# Patient Record
Sex: Male | Born: 1960 | Race: White | Hispanic: No | Marital: Married | Smoking: Never smoker
Health system: Southern US, Community
[De-identification: ages and names within clinical notes are randomized; demographics above are authoritative.]

## PROBLEM LIST (undated history)

## (undated) DIAGNOSIS — J189 Pneumonia, unspecified organism: Secondary | ICD-10-CM

## (undated) DIAGNOSIS — G4730 Sleep apnea, unspecified: Secondary | ICD-10-CM

## (undated) DIAGNOSIS — T8859XA Other complications of anesthesia, initial encounter: Secondary | ICD-10-CM

## (undated) DIAGNOSIS — T4145XA Adverse effect of unspecified anesthetic, initial encounter: Secondary | ICD-10-CM

## (undated) DIAGNOSIS — Z460 Encounter for fitting and adjustment of spectacles and contact lenses: Secondary | ICD-10-CM

## (undated) DIAGNOSIS — E039 Hypothyroidism, unspecified: Secondary | ICD-10-CM

## (undated) DIAGNOSIS — I10 Essential (primary) hypertension: Secondary | ICD-10-CM

## (undated) DIAGNOSIS — K649 Unspecified hemorrhoids: Secondary | ICD-10-CM

## (undated) DIAGNOSIS — J302 Other seasonal allergic rhinitis: Secondary | ICD-10-CM

## (undated) DIAGNOSIS — Z9889 Other specified postprocedural states: Secondary | ICD-10-CM

## (undated) DIAGNOSIS — R112 Nausea with vomiting, unspecified: Secondary | ICD-10-CM

## (undated) HISTORY — PX: COLONOSCOPY: SHX174

---
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
## 1992-05-28 DIAGNOSIS — J189 Pneumonia, unspecified organism: Secondary | ICD-10-CM

## 1992-05-28 HISTORY — DX: Pneumonia, unspecified organism: J18.9

## 2011-08-16 ENCOUNTER — Ambulatory Visit (HOSPITAL_COMMUNITY)
Admission: RE | Admit: 2011-08-16 | Discharge: 2011-08-16 | Disposition: A | Discharge status: Home / Self Care | Payer: PRIVATE HEALTH INSURANCE | Source: Ambulatory Visit | Attending: Family Medicine | Admitting: Family Medicine

## 2011-08-16 ENCOUNTER — Other Ambulatory Visit: Payer: Self-pay | Admitting: Family Medicine

## 2011-08-16 ENCOUNTER — Encounter (HOSPITAL_COMMUNITY): Payer: Self-pay

## 2011-08-16 DIAGNOSIS — R05 Cough: Secondary | ICD-10-CM | POA: Insufficient documentation

## 2011-08-16 DIAGNOSIS — R059 Cough, unspecified: Secondary | ICD-10-CM | POA: Insufficient documentation

## 2011-08-16 DIAGNOSIS — R509 Fever, unspecified: Secondary | ICD-10-CM | POA: Insufficient documentation

## 2011-08-16 DIAGNOSIS — R062 Wheezing: Secondary | ICD-10-CM | POA: Insufficient documentation

## 2011-08-16 HISTORY — DX: Essential (primary) hypertension: I10

## 2012-12-01 ENCOUNTER — Ambulatory Visit (INDEPENDENT_AMBULATORY_CARE_PROVIDER_SITE_OTHER): Payer: PRIVATE HEALTH INSURANCE | Admitting: Family Medicine

## 2012-12-01 ENCOUNTER — Encounter: Payer: Self-pay | Admitting: Family Medicine

## 2012-12-01 VITALS — BP 128/80 | HR 80 | Ht 71.75 in | Wt 246.0 lb

## 2012-12-01 DIAGNOSIS — R5381 Other malaise: Secondary | ICD-10-CM

## 2012-12-01 DIAGNOSIS — G4733 Obstructive sleep apnea (adult) (pediatric): Secondary | ICD-10-CM

## 2012-12-01 DIAGNOSIS — I10 Essential (primary) hypertension: Secondary | ICD-10-CM

## 2012-12-01 DIAGNOSIS — F528 Other sexual dysfunction not due to a substance or known physiological condition: Secondary | ICD-10-CM

## 2012-12-01 DIAGNOSIS — E785 Hyperlipidemia, unspecified: Secondary | ICD-10-CM

## 2012-12-01 DIAGNOSIS — Z0000 Encounter for general adult medical examination without abnormal findings: Secondary | ICD-10-CM

## 2012-12-01 NOTE — Patient Instructions (Addendum)
DASH Diet  The DASH diet stands for "Dietary Approaches to Stop Hypertension." It is a healthy eating plan that has been shown to reduce high blood pressure (hypertension) in as little as 14 days, while also possibly providing other significant health benefits. These other health benefits include reducing the risk of breast cancer after menopause and reducing the risk of type 2 diabetes, heart disease, colon cancer, and stroke. Health benefits also include weight loss and slowing kidney failure in patients with chronic kidney disease.   DIET GUIDELINES  · Limit salt (sodium). Your diet should contain less than 1500 mg of sodium daily.  · Limit refined or processed carbohydrates. Your diet should include mostly whole grains. Desserts and added sugars should be used sparingly.  · Include small amounts of heart-healthy fats. These types of fats include nuts, oils, and tub margarine. Limit saturated and trans fats. These fats have been shown to be harmful in the body.  CHOOSING FOODS   The following food groups are based on a 2000 calorie diet. See your Registered Dietitian for individual calorie needs.  Grains and Grain Products (6 to 8 servings daily)  · Eat More Often: Whole-wheat bread, brown rice, whole-grain or wheat pasta, quinoa, popcorn without added fat or salt (air popped).  · Eat Less Often: White bread, white pasta, white rice, cornbread.  Vegetables (4 to 5 servings daily)  · Eat More Often: Fresh, frozen, and canned vegetables. Vegetables may be raw, steamed, roasted, or grilled with a minimal amount of fat.  · Eat Less Often/Avoid: Creamed or fried vegetables. Vegetables in a cheese sauce.  Fruit (4 to 5 servings daily)  · Eat More Often: All fresh, canned (in natural juice), or frozen fruits. Dried fruits without added sugar. One hundred percent fruit juice (½ cup [237 mL] daily).  · Eat Less Often: Dried fruits with added sugar. Canned fruit in light or heavy syrup.  Lean Meats, Fish, and Poultry (2  servings or less daily. One serving is 3 to 4 oz [85-114 g]).  · Eat More Often: Ninety percent or leaner ground beef, tenderloin, sirloin. Round cuts of beef, chicken breast, turkey breast. All fish. Grill, bake, or broil your meat. Nothing should be fried.  · Eat Less Often/Avoid: Fatty cuts of meat, turkey, or chicken leg, thigh, or wing. Fried cuts of meat or fish.  Dairy (2 to 3 servings)  · Eat More Often: Low-fat or fat-free milk, low-fat plain or light yogurt, reduced-fat or part-skim cheese.  · Eat Less Often/Avoid: Milk (whole, 2%). Whole milk yogurt. Full-fat cheeses.  Nuts, Seeds, and Legumes (4 to 5 servings per week)  · Eat More Often: All without added salt.  · Eat Less Often/Avoid: Salted nuts and seeds, canned beans with added salt.  Fats and Sweets (limited)  · Eat More Often: Vegetable oils, tub margarines without trans fats, sugar-free gelatin. Mayonnaise and salad dressings.  · Eat Less Often/Avoid: Coconut oils, palm oils, butter, stick margarine, cream, half and half, cookies, candy, pie.  FOR MORE INFORMATION  The Dash Diet Eating Plan: www.dashdiet.org  Document Released: 05/03/2011 Document Revised: 08/06/2011 Document Reviewed: 05/03/2011  ExitCare® Patient Information ©2014 [employer].

## 2012-12-01 NOTE — Progress Notes (Addendum)
  Subjective:    Patient ID: Mitchell Holden, male    DOB: [birth year], 52 y.o.   MRN: [id number]  HPI Overall pt doing well. He relates he could increase exercise and decrease calories. Wellness discussed in detail. Also decreasing risk for MI/HTN/DM discussed. Pt w/o hematuria or rectal bleeding. Compliant with meds. Pros and cons of testosterone discussed including risk of MI,stroke with testos and how that increases with age. Pt prefers to keep as is for now. Patient in today for a physical. Need form filled out for work.  Review of Systems  Constitutional: Negative for fever, activity change and appetite change.  HENT: Negative for congestion, rhinorrhea and neck pain.   Eyes: Negative for discharge.  Respiratory: Negative for cough and wheezing.   Cardiovascular: Negative for chest pain.  Gastrointestinal: Negative for vomiting, abdominal pain and blood in stool.  Genitourinary: Negative for frequency and difficulty urinating.  Skin: Negative for rash.  Allergic/Immunologic: Negative for environmental allergies and food allergies.  Neurological: Negative for weakness and headaches.  Psychiatric/Behavioral: Negative for agitation.       Objective:   Physical Exam  Nursing note and vitals reviewed. Constitutional: He appears well-developed and well-nourished.  HENT:  Head: Normocephalic and atraumatic.  Right Ear: External ear normal.  Left Ear: External ear normal.  Nose: Nose normal.  Mouth/Throat: Oropharynx is clear and moist.  Eyes: EOM are normal. Pupils are equal, round, and reactive to light.  Neck: Normal range of motion. Neck supple. No thyromegaly present.  Cardiovascular: Normal rate, regular rhythm and normal heart sounds.   No murmur heard. Pulmonary/Chest: Effort normal and breath sounds normal. No respiratory distress. He has no wheezes.  Abdominal: Soft. Bowel sounds are normal. He exhibits no distension and no mass. There is no tenderness.   Genitourinary: Penis normal.  Musculoskeletal: Normal range of motion. He exhibits no edema.  Lymphadenopathy:    He has no cervical adenopathy.  Neurological: He is alert. He exhibits normal muscle tone.  Skin: Skin is warm and dry. No erythema.  Psychiatric: He has a normal mood and affect. His behavior is normal. Judgment normal.          Assessment & Plan:  To do labs,then fill in form Continue testosterone- potential risk discussd Lose weight, increase activity, watch diet UTD on colon Recheck 6 months/due PSA then Refer for out pt sleep study pt states he can't afford inpatient study   This patient was seen for wellness exam on this date

## 2012-12-02 LAB — CBC WITH DIFFERENTIAL/PLATELET
Basophils Absolute: 0 10*3/uL (ref 0.0–0.1)
Eosinophils Absolute: 0.2 10*3/uL (ref 0.0–0.7)
Lymphs Abs: 2.5 10*3/uL (ref 0.7–4.0)
MCH: 28.3 pg (ref 26.0–34.0)
Neutrophils Relative %: 65 % (ref 43–77)
Platelets: 255 10*3/uL (ref 150–400)
RBC: 5.55 MIL/uL (ref 4.22–5.81)
RDW: 14.3 % (ref 11.5–15.5)
WBC: 8.8 10*3/uL (ref 4.0–10.5)

## 2012-12-02 LAB — LIPID PANEL
HDL: 29 mg/dL — ABNORMAL LOW (ref >39)
LDL Cholesterol: 62 mg/dL (ref 0–99)
Triglycerides: 181 mg/dL — ABNORMAL HIGH (ref <150)
VLDL: 36 mg/dL (ref 0–40)

## 2012-12-02 LAB — HEPATIC FUNCTION PANEL
ALT: 40 U/L (ref 0–53)
Albumin: 5 g/dL (ref 3.5–5.2)
Alkaline Phosphatase: 62 U/L (ref 39–117)
Total Protein: 7.5 g/dL (ref 6.0–8.3)

## 2012-12-02 LAB — TESTOSTERONE: Testosterone: 660 ng/dL (ref 300–890)

## 2012-12-11 ENCOUNTER — Other Ambulatory Visit: Payer: Self-pay | Admitting: Family Medicine

## 2012-12-12 NOTE — Telephone Encounter (Signed)
Ok with 2 ref

## 2012-12-12 NOTE — Telephone Encounter (Signed)
RX called in on voicemail 

## 2013-01-19 ENCOUNTER — Telehealth: Payer: Self-pay | Admitting: Family Medicine

## 2013-01-19 NOTE — Telephone Encounter (Signed)
Patient wants to know if we have received his sleep study.

## 2013-01-19 NOTE — Telephone Encounter (Signed)
Discussed with patient and chart forwarded to brendale.

## 2013-02-10 ENCOUNTER — Telehealth: Payer: Self-pay | Admitting: Family Medicine

## 2013-02-10 NOTE — Telephone Encounter (Signed)
Patient says that he was given the results to his recent labs, but was not told on what to do to followup or the next action taken. Please advise.

## 2013-02-11 NOTE — Telephone Encounter (Signed)
Left message to return call 

## 2013-02-11 NOTE — Telephone Encounter (Signed)
See notes on last labwork, pt was seen in July, please see if he has any other concerns,  I would recommend to continue the medications he is on and follow up by Dec , or Jan at the latestr. Repeat labs not needed currently. Certainly if he has other problems I am open to seeing him anytime.

## 2013-02-12 NOTE — Telephone Encounter (Signed)
Spoke with patient regarding: notes on last labwork, pt was seen in July, please see if he has any other concerns,  I would recommend to continue the medications he is on and follow up by Dec , or Jan at the latestr. Repeat labs not needed currently. Patient verbalized understanding and made an appt in Oct to discuss sleep equipment, such as a C-PAP.

## 2013-02-19 ENCOUNTER — Encounter: Payer: Self-pay | Admitting: Family Medicine

## 2013-02-19 ENCOUNTER — Ambulatory Visit (INDEPENDENT_AMBULATORY_CARE_PROVIDER_SITE_OTHER): Payer: PRIVATE HEALTH INSURANCE | Admitting: Family Medicine

## 2013-02-19 VITALS — BP 164/98 | Temp 98.4°F | Ht 73.0 in | Wt 237.8 lb

## 2013-02-19 DIAGNOSIS — J019 Acute sinusitis, unspecified: Secondary | ICD-10-CM

## 2013-02-19 MED ORDER — LEVOFLOXACIN 500 MG PO TABS: 500.0000 mg | ORAL_TABLET | Freq: Every day | ORAL | Status: AC

## 2013-02-19 MED ORDER — HYOSCYAMINE SULFATE 0.125 MG SL SUBL: 0.1250 mg | SUBLINGUAL_TABLET | SUBLINGUAL | Status: DC | PRN

## 2013-02-19 NOTE — Progress Notes (Signed)
  Subjective:    Patient ID: Mitchell Holden, male    DOB: 10/07/1960, 52 y.o.   MRN: [id number]  Sinusitis This is a new problem. The current episode started in the past 7 days. There has been no fever. Associated symptoms include congestion, headaches and sinus pressure. (Abdominal pain and diarrhea) Past treatments include oral decongestants and acetaminophen. The treatment provided no relief.   Some chills, no sweats  Does not smoke Review of Systems  HENT: Positive for congestion and sinus pressure.   Neurological: Positive for headaches.       Objective:   Physical Exam  Nursing note and vitals reviewed. Constitutional: He appears well-developed.  HENT:  Head: Normocephalic.  Mouth/Throat: Oropharynx is clear and moist. No oropharyngeal exudate.  Neck: Normal range of motion.  Cardiovascular: Normal rate, regular rhythm and normal heart sounds.   No murmur heard. Pulmonary/Chest: Effort normal and breath sounds normal. He has no wheezes.  Lymphadenopathy:    He has no cervical adenopathy.  Neurological: He exhibits normal muscle tone.  Skin: Skin is warm and dry.          Assessment & Plan:  Sinusitis-antibiotics prescribed if ongoing trouble followup Will track down his sleep apnea study and discuss this in detail upon resection. He has an appointment in October to discuss.

## 2013-03-03 ENCOUNTER — Encounter: Payer: Self-pay | Admitting: Family Medicine

## 2013-03-03 ENCOUNTER — Ambulatory Visit (INDEPENDENT_AMBULATORY_CARE_PROVIDER_SITE_OTHER): Payer: PRIVATE HEALTH INSURANCE | Admitting: Family Medicine

## 2013-03-03 VITALS — BP 132/90 | Ht 73.0 in | Wt 232.0 lb

## 2013-03-03 DIAGNOSIS — E039 Hypothyroidism, unspecified: Secondary | ICD-10-CM | POA: Insufficient documentation

## 2013-03-03 DIAGNOSIS — G4733 Obstructive sleep apnea (adult) (pediatric): Secondary | ICD-10-CM | POA: Insufficient documentation

## 2013-03-03 MED ORDER — CEFPROZIL 500 MG PO TABS: 500.0000 mg | ORAL_TABLET | Freq: Two times a day (BID) | ORAL | Status: DC

## 2013-03-03 MED ORDER — DOXYCYCLINE HYCLATE 100 MG PO CAPS: 100.0000 mg | ORAL_CAPSULE | Freq: Two times a day (BID) | ORAL | Status: DC

## 2013-03-03 NOTE — Progress Notes (Signed)
  Subjective:    Patient ID: Mitchell Holden, male    DOB: 02/05/1961, 53 y.o.   MRN: [id number]  Sinusitis This is a new problem. The current episode started 1 to 4 weeks ago. The problem is unchanged. There has been no fever. His pain is at a severity of 6/10. The pain is mild. Associated symptoms include congestion, headaches, sinus pressure and sneezing. Past treatments include antibiotics. The treatment provided mild relief.   Patient is here today to discuss sleep apnea concerns. Significant time spent with patient discussing the findings of his home sleep study. This patient would benefit from CPAP machine. We will arrange for him to have it automated CPAP machine. He denies any other particular troubles currently. He is working to lose weight and he has dropped some.  PMH hypothyroidism does not smoke   Review of Systems  HENT: Positive for congestion, sneezing and sinus pressure.   Neurological: Positive for headaches.       Objective:   Physical Exam Lungs are clear hearts regular eardrums normal throat normal neck supple blood pressure on recheck good       Assessment & Plan:  #1 sleep apnea-we will arrange home CPAP machine. Followup when necessary we will have them send [place] a download one month after using it. #2 sinusitis prescription for Dr.-given if symptoms return currently right now I feel he is in the recovering phase hopefully he does not need further antibiotics.

## 2013-03-12 ENCOUNTER — Other Ambulatory Visit: Payer: Self-pay | Admitting: Family Medicine

## 2013-03-16 ENCOUNTER — Other Ambulatory Visit: Payer: Self-pay | Admitting: Family Medicine

## 2013-03-16 NOTE — Telephone Encounter (Signed)
Refill times 3 

## 2013-03-16 NOTE — Telephone Encounter (Signed)
[name]?

## 2013-03-19 ENCOUNTER — Telehealth: Payer: Self-pay | Admitting: Family Medicine

## 2013-03-19 NOTE — Telephone Encounter (Signed)
Pt stated he has tried the using the CPAP for 6 days. Started on it last Friday. He states he just cannot sleep or get any rest and causes his nose to be stopped up. He has already contacted the CPAP supplier and wants a letter stating he cannot use CPAP.

## 2013-03-19 NOTE — Telephone Encounter (Signed)
Nurse to call patient. Please ask him what is going on and how we can help. Typically most places want a person to try the CPAP machine for at least 5-7 days. Often it takes time for patient to get use to it. Some people are just not able to tolerate it. Also if he wants to return it (because he says he absolutely cannot tolerate it) has he talked with the medical supply place that issue it? He should discuss with them what the procedures are to return it. If he needs a letter from [place] stating he absolutely cannot use the machine and wants to return it we can help supply the letter. Let me know

## 2013-03-19 NOTE — Telephone Encounter (Signed)
Left message to return call 

## 2013-03-19 NOTE — Telephone Encounter (Signed)
Pt calling to say the company with the CPAP said they would deal with the insurance company if you will agree for him to use the CPAP over the weekend. They told him it was probably due to his being stuffy and hard to breathe during use from the sinus congestion.   Do you also want him to go ahead an use the 2nd round of antibiotics that you had issued him? He still has some sinus congestion and nasal bleeding.

## 2013-03-19 NOTE — Telephone Encounter (Signed)
Patient said he picked up cpap machine on Friday because of sleep apnea, but states that he is absolutely not able to sleep with use.  He also requests that we do what we need to do to get this machine returned.

## 2013-03-19 NOTE — Telephone Encounter (Signed)
Yet yes he may use a second round of antibiotics in addition to this saline nasal spray frequently. Let's see how it goes over the week and give [place] a update next week then if needing a letter I can give her a letter. We await his update next week, nurses will call patient about this.

## 2013-03-20 NOTE — Telephone Encounter (Signed)
Notified patient yet yes he may use a second round of antibiotics in addition to this saline nasal spray frequently. Let's see how it goes over the week and give [place] a update next week then if needing a letter doctor can give him a letter. We await his update next week, patient stated that he will call us with a update. Patient verbalized understanding.

## 2013-03-24 ENCOUNTER — Other Ambulatory Visit: Payer: Self-pay | Admitting: Family Medicine

## 2013-03-24 NOTE — Telephone Encounter (Signed)
Ok times 4 

## 2013-03-27 ENCOUNTER — Other Ambulatory Visit: Payer: Self-pay | Admitting: Family Medicine

## 2013-03-30 ENCOUNTER — Other Ambulatory Visit: Payer: Self-pay

## 2013-03-30 MED ORDER — TESTOSTERONE 50 MG/5GM (1%) TD GEL: TRANSDERMAL | Status: DC

## 2013-04-02 NOTE — Telephone Encounter (Signed)
Ok times three, 90 day, might have already been done

## 2013-04-17 ENCOUNTER — Other Ambulatory Visit: Payer: Self-pay | Admitting: *Deleted

## 2013-04-17 MED ORDER — TESTOSTERONE 50 MG/5GM (1%) TD GEL: TRANSDERMAL | Status: DC

## 2013-05-13 ENCOUNTER — Ambulatory Visit: Payer: PRIVATE HEALTH INSURANCE | Admitting: Family Medicine

## 2013-05-25 IMAGING — CR DG CHEST 2V
2 series · 2 of 2 positions shown · non-contrast
Comparison: None.

CLINICAL DATA: Cough.  Wheezing and low grade fever.

CHEST - 2 VIEW

[view not recorded (1 of 2)]
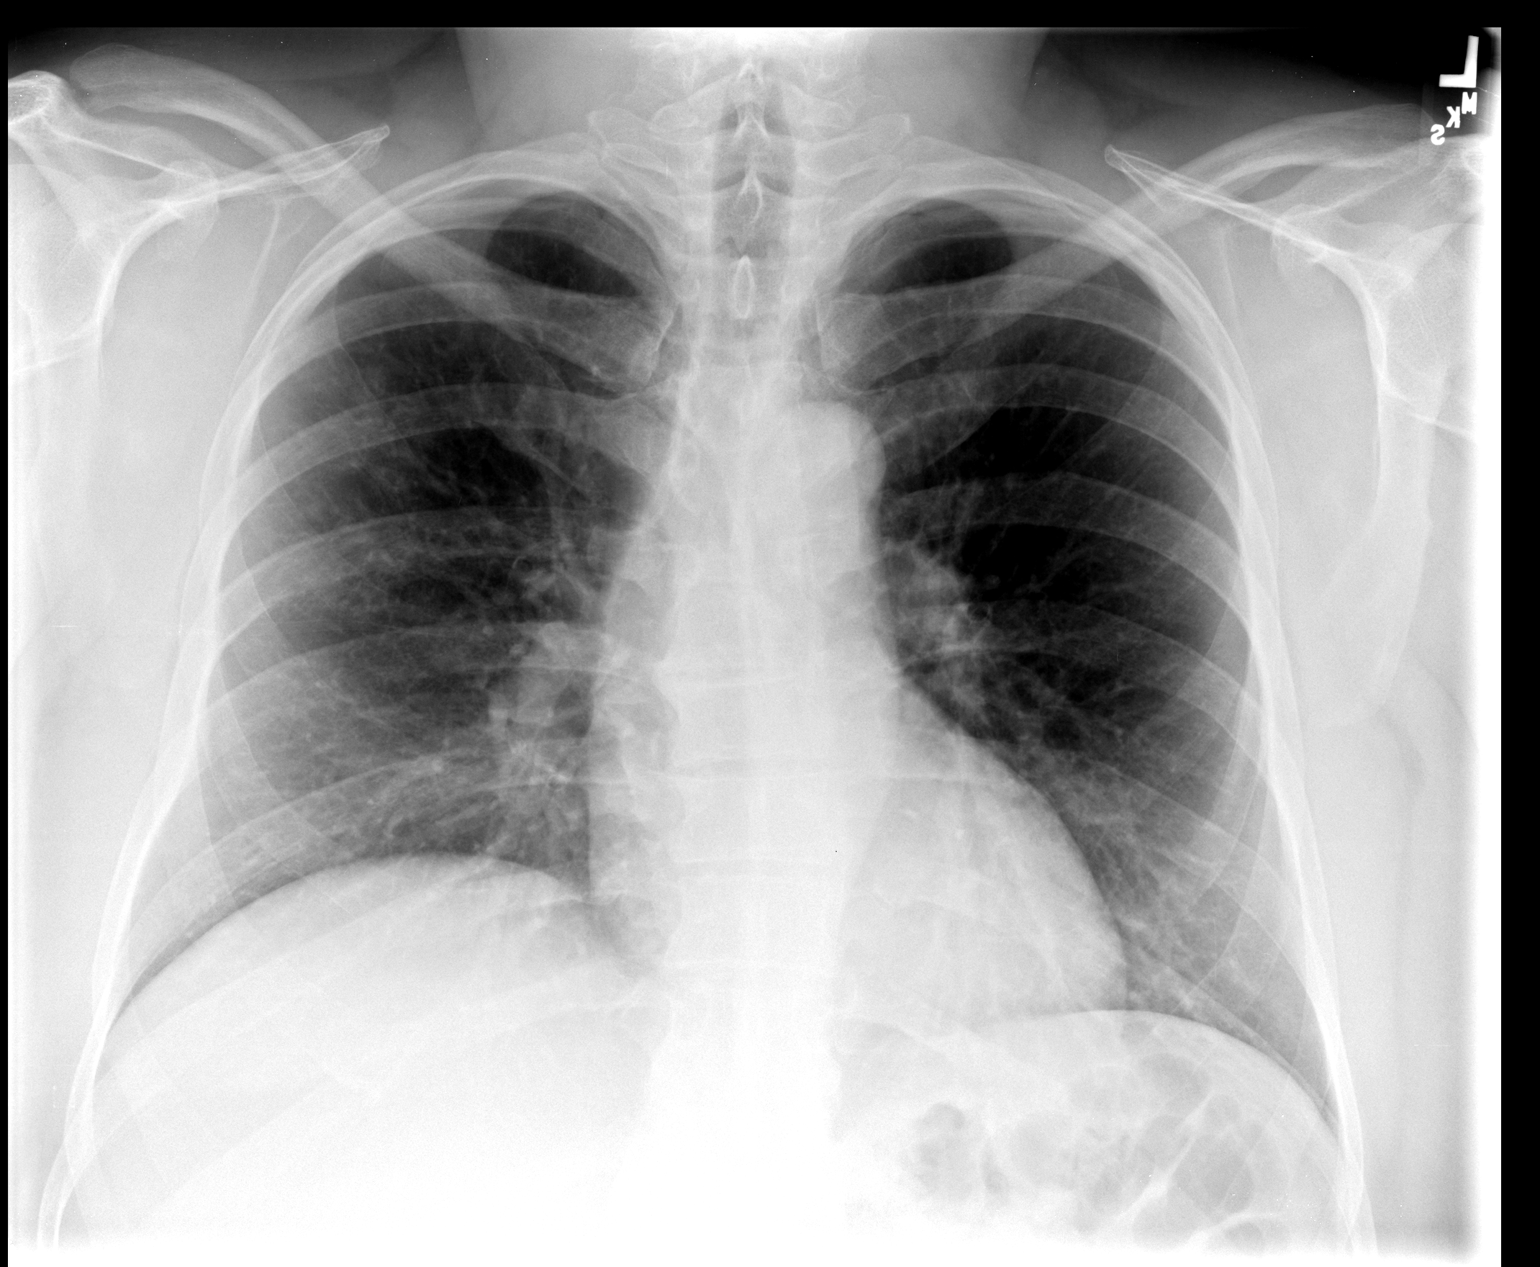

[view not recorded (2 of 2)]
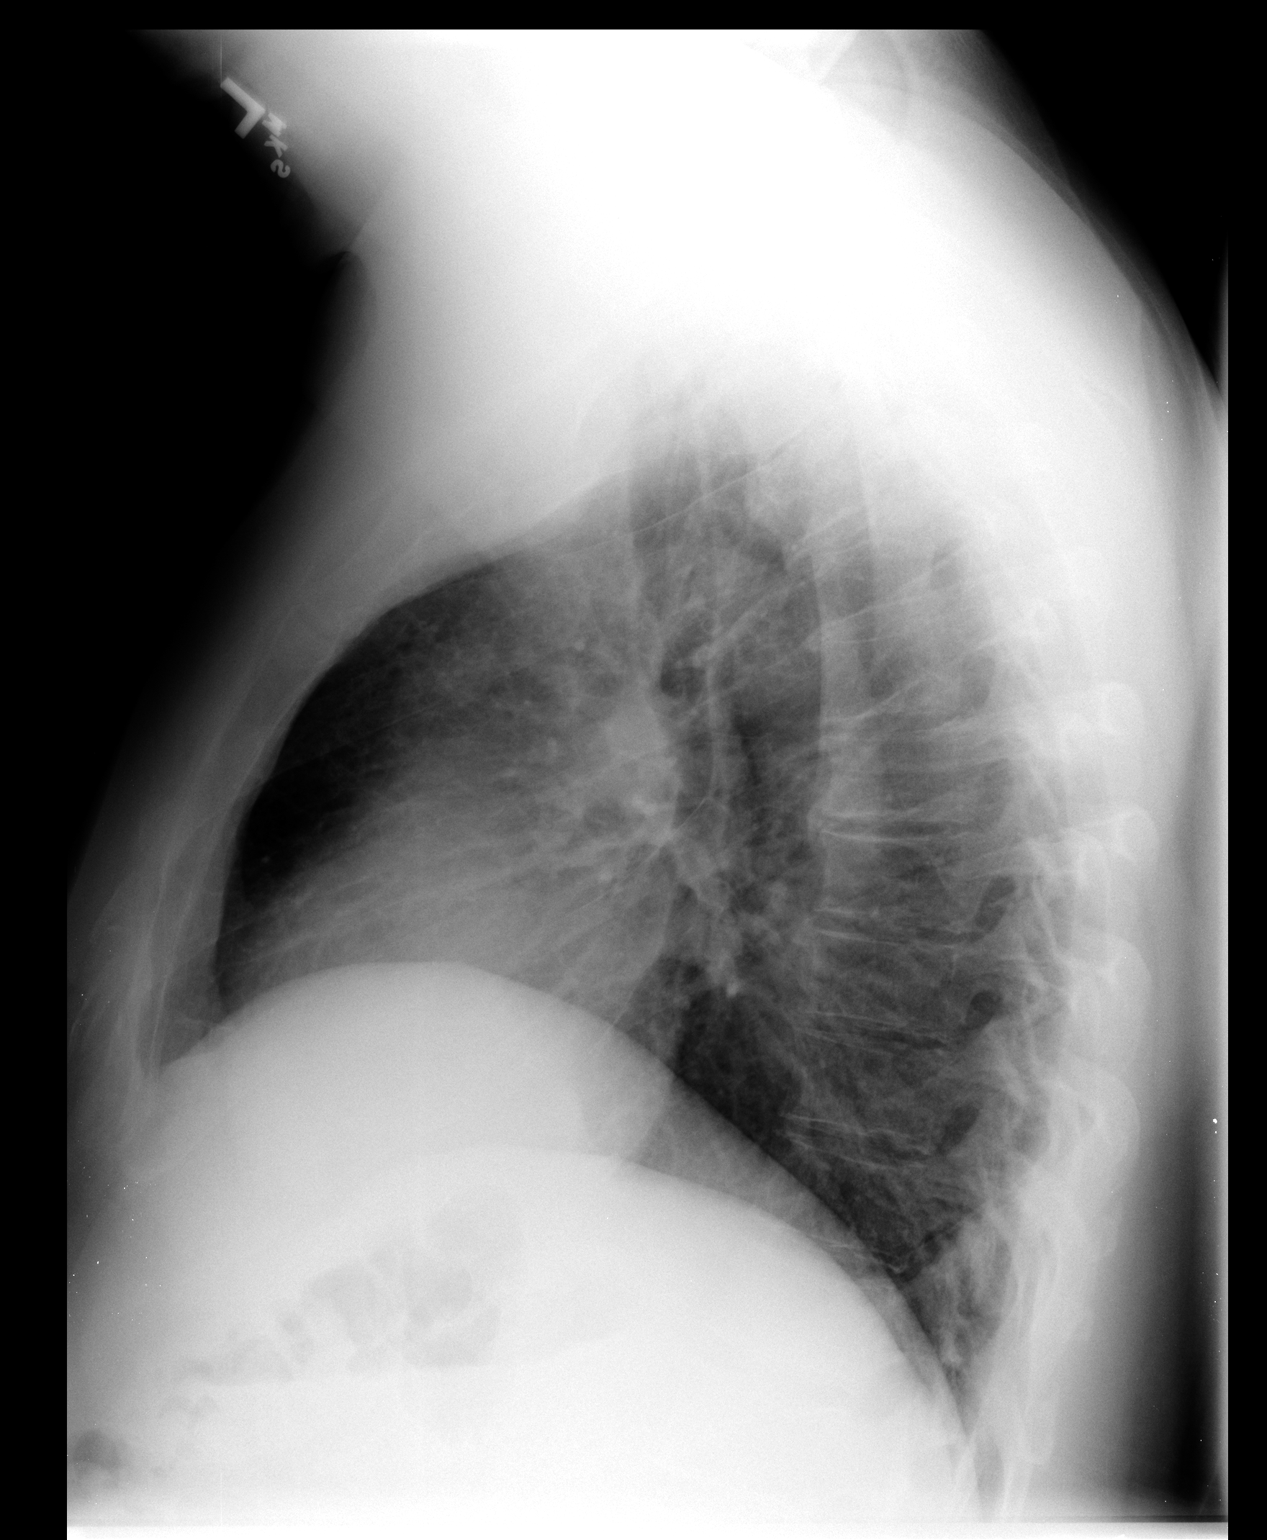

[2 of 2 positions shown; findings below may reference images not displayed]

FINDINGS: The heart size is normal.  The lung volumes are low.  The
visualized soft tissues and bony thorax are unremarkable.
IMPRESSION: Negative chest.

## 2013-06-23 ENCOUNTER — Ambulatory Visit (INDEPENDENT_AMBULATORY_CARE_PROVIDER_SITE_OTHER): Payer: PRIVATE HEALTH INSURANCE | Admitting: Family Medicine

## 2013-06-23 ENCOUNTER — Encounter: Payer: Self-pay | Admitting: Family Medicine

## 2013-06-23 VITALS — BP 156/90 | Ht 73.0 in | Wt 242.6 lb

## 2013-06-23 DIAGNOSIS — I10 Essential (primary) hypertension: Secondary | ICD-10-CM

## 2013-06-23 DIAGNOSIS — J342 Deviated nasal septum: Secondary | ICD-10-CM

## 2013-06-23 DIAGNOSIS — E039 Hypothyroidism, unspecified: Secondary | ICD-10-CM

## 2013-06-23 DIAGNOSIS — G4733 Obstructive sleep apnea (adult) (pediatric): Secondary | ICD-10-CM

## 2013-06-23 MED ORDER — LEVOTHYROXINE SODIUM 200 MCG PO TABS: ORAL_TABLET | ORAL | Status: DC

## 2013-06-23 MED ORDER — LISINOPRIL 5 MG PO TABS: 5.0000 mg | ORAL_TABLET | Freq: Every day | ORAL | Status: DC

## 2013-06-23 NOTE — Progress Notes (Signed)
   Subjective:    Patient ID: Mitchell Holden, male    DOB: [birth year], 53 y.o.   MRN: [id number]  HPI Patient arrives for a follow up on blood pressure and thyroid.   This patient is frustrated he is trying he uses CPAP machine he wants to use a CPAP machine but he cannot because he can't breathe through his nose he has resorted to using nasal decongestants and oral decongestants this may be contributing to his blood pressure mean elevated  He is also under a lot of stress at work and at home is unable to exercise on regular basis. He denies any chest pain shortness breath nausea vomiting diarrhea Denies swelling in the legs.  Review of Systems  Constitutional: Negative for activity change, appetite change and fatigue.  HENT: Positive for congestion, postnasal drip and sinus pressure. Negative for nosebleeds and rhinorrhea.   Respiratory: Negative for cough and chest tightness.   Cardiovascular: Negative for chest pain.  Gastrointestinal: Negative for abdominal pain.  Endocrine: Negative for polydipsia and polyphagia.  Musculoskeletal: Negative for arthralgias.  Neurological: Negative for weakness.  Psychiatric/Behavioral: Negative for confusion.   Negative for fevers    Objective:   Physical Exam Lungs are clear hearts regular Extremities no edema Blood pressure mildly elevated best reading 148/92 Severely deviated septum minimal airway in the nasal passages       Assessment & Plan:  #1 sleep apnea -- he uses a machine that he has a very difficult time because of his nasal septum deviation we will be setting him up with specialist #2 septal deviation along with significant breathing issues in his nose Will refer patient [facility] [place] [place] [facility] [place] I encouraged patient to minimize oral decongestants and avoid nasal decongestants. He has tried Flonase before in the past and stated it did not help. He has a severe deviation  #3 HTN-poor control. I believe  this patient would benefit from having lisinopril 5 mg half tablet daily for the next 2 weeks then one tablet daily he will let us know if his blood pressures are not getting back to where they need be if ongoing troubles notify us patient should have complete laboratory workup by July with followup exam

## 2013-06-23 NOTE — Patient Instructions (Signed)
DASH Diet  The DASH diet stands for "Dietary Approaches to Stop Hypertension." It is a healthy eating plan that has been shown to reduce high blood pressure (hypertension) in as little as 14 days, while also possibly providing other significant health benefits. These other health benefits include reducing the risk of breast cancer after menopause and reducing the risk of type 2 diabetes, heart disease, colon cancer, and stroke. Health benefits also include weight loss and slowing kidney failure in patients with chronic kidney disease.   DIET GUIDELINES  · Limit salt (sodium). Your diet should contain less than 1500 mg of sodium daily.  · Limit refined or processed carbohydrates. Your diet should include mostly whole grains. Desserts and added sugars should be used sparingly.  · Include small amounts of heart-healthy fats. These types of fats include nuts, oils, and tub margarine. Limit saturated and trans fats. These fats have been shown to be harmful in the body.  CHOOSING FOODS   The following food groups are based on a 2000 calorie diet. See your Registered Dietitian for individual calorie needs.  Grains and Grain Products (6 to 8 servings daily)  · Eat More Often: Whole-wheat bread, brown rice, whole-grain or wheat pasta, quinoa, popcorn without added fat or salt (air popped).  · Eat Less Often: White bread, white pasta, white rice, cornbread.  Vegetables (4 to 5 servings daily)  · Eat More Often: Fresh, frozen, and canned vegetables. Vegetables may be raw, steamed, roasted, or grilled with a minimal amount of fat.  · Eat Less Often/Avoid: Creamed or fried vegetables. Vegetables in a cheese sauce.  Fruit (4 to 5 servings daily)  · Eat More Often: All fresh, canned (in natural juice), or frozen fruits. Dried fruits without added sugar. One hundred percent fruit juice (½ cup [237 mL] daily).  · Eat Less Often: Dried fruits with added sugar. Canned fruit in light or heavy syrup.  Lean Meats, Fish, and Poultry (2  servings or less daily. One serving is 3 to 4 oz [85-114 g]).  · Eat More Often: Ninety percent or leaner ground beef, tenderloin, sirloin. Round cuts of beef, chicken breast, turkey breast. All fish. Grill, bake, or broil your meat. Nothing should be fried.  · Eat Less Often/Avoid: Fatty cuts of meat, turkey, or chicken leg, thigh, or wing. Fried cuts of meat or fish.  Dairy (2 to 3 servings)  · Eat More Often: Low-fat or fat-free milk, low-fat plain or light yogurt, reduced-fat or part-skim cheese.  · Eat Less Often/Avoid: Milk (whole, 2%). Whole milk yogurt. Full-fat cheeses.  Nuts, Seeds, and Legumes (4 to 5 servings per week)  · Eat More Often: All without added salt.  · Eat Less Often/Avoid: Salted nuts and seeds, canned beans with added salt.  Fats and Sweets (limited)  · Eat More Often: Vegetable oils, tub margarines without trans fats, sugar-free gelatin. Mayonnaise and salad dressings.  · Eat Less Often/Avoid: Coconut oils, palm oils, butter, stick margarine, cream, half and half, cookies, candy, pie.  FOR MORE INFORMATION  The Dash Diet Eating Plan: www.dashdiet.org  Document Released: 05/03/2011 Document Revised: 08/06/2011 Document Reviewed: 05/03/2011  [employer]® Patient Information ©2014 [employer].

## 2013-06-23 NOTE — Addendum Note (Signed)
Addended by: Lilyan PuntLUKING, SCOTT A on: 06/23/2013 09:42 PM   Modules accepted: Orders

## 2013-07-08 ENCOUNTER — Encounter: Payer: Self-pay | Admitting: Family Medicine

## 2013-07-08 ENCOUNTER — Ambulatory Visit (INDEPENDENT_AMBULATORY_CARE_PROVIDER_SITE_OTHER): Payer: PRIVATE HEALTH INSURANCE | Admitting: Family Medicine

## 2013-07-08 VITALS — BP 158/92 | Temp 98.2°F | Ht 73.0 in | Wt 240.0 lb

## 2013-07-08 DIAGNOSIS — J019 Acute sinusitis, unspecified: Secondary | ICD-10-CM

## 2013-07-08 DIAGNOSIS — J342 Deviated nasal septum: Secondary | ICD-10-CM

## 2013-07-08 MED ORDER — DOXYCYCLINE HYCLATE 100 MG PO CAPS: 100.0000 mg | ORAL_CAPSULE | Freq: Two times a day (BID) | ORAL | Status: DC

## 2013-07-08 NOTE — Progress Notes (Signed)
   Subjective:    Patient ID: Mitchell Holden, male    DOB: 12-14-1960, 53 y.o.   MRN: [id number]  Sinusitis This is a new problem. The current episode started in the past 7 days. Maximum temperature: low grade. Associated symptoms include chills, congestion, coughing, headaches and sinus pressure. Past treatments include oral decongestants. The treatment provided mild relief.   137/75 at home Has had frequent sinus infections deviated septum.   Review of Systems  Constitutional: Positive for chills.  HENT: Positive for congestion and sinus pressure.   Respiratory: Positive for cough.   Neurological: Positive for headaches.       Objective:   Physical Exam  Nursing note and vitals reviewed. Constitutional: He appears well-developed.  HENT:  Head: Normocephalic.  Mouth/Throat: Oropharynx is clear and moist. No oropharyngeal exudate.  Neck: Normal range of motion.  Cardiovascular: Normal rate, regular rhythm and normal heart sounds.   No murmur heard. Pulmonary/Chest: Effort normal and breath sounds normal. He has no wheezes.  Lymphadenopathy:    He has no cervical adenopathy.  Neurological: He exhibits normal muscle tone.  Skin: Skin is warm and dry.          Assessment & Plan:  Viral syndrome with acute sinusitis antibiotics discussed. Patient also needs to see the ENT doctor we will followup on the progress of this referral. Warning signs discussed.

## 2013-08-17 ENCOUNTER — Encounter: Payer: Self-pay | Admitting: Family Medicine

## 2013-08-17 ENCOUNTER — Ambulatory Visit (INDEPENDENT_AMBULATORY_CARE_PROVIDER_SITE_OTHER): Payer: PRIVATE HEALTH INSURANCE | Admitting: Family Medicine

## 2013-08-17 VITALS — BP 140/88 | Temp 98.3°F | Ht 73.0 in | Wt 240.0 lb

## 2013-08-17 DIAGNOSIS — J019 Acute sinusitis, unspecified: Secondary | ICD-10-CM

## 2013-08-17 MED ORDER — SULFAMETHOXAZOLE-TMP DS 800-160 MG PO TABS: 1.0000 | ORAL_TABLET | Freq: Two times a day (BID) | ORAL | Status: DC

## 2013-08-17 NOTE — Progress Notes (Signed)
   Subjective:    Patient ID: Mitchell Holden, male    DOB: 1961-03-07, 53 y.o.   MRN: [id number]  Sinus Problem This is a new problem. The current episode started in the past 7 days. Associated symptoms include congestion, coughing, headaches and sinus pressure.   Had fever 100 on Saturday Hx deviated septum No wheeze Max and ehtmoid and frontal pain Fatigued Bloody discharge/ yellow discharge   Review of Systems  HENT: Positive for congestion and sinus pressure.   Respiratory: Positive for cough.   Neurological: Positive for headaches.       Objective:   Physical Exam  Nursing note and vitals reviewed. Constitutional: He appears well-developed.  HENT:  Head: Normocephalic.  Mouth/Throat: Oropharynx is clear and moist. No oropharyngeal exudate.  Tender sinuses  Neck: Normal range of motion.  Cardiovascular: Normal rate, regular rhythm and normal heart sounds.   No murmur heard. Pulmonary/Chest: Effort normal and breath sounds normal. He has no wheezes.  Lymphadenopathy:    He has no cervical adenopathy.  Neurological: He exhibits normal muscle tone.  Skin: Skin is warm and dry.          Assessment & Plan:  Acute sinusitis-to take antibiotics as prescribed warning signs discussed. ENT scheduled, he will followup with them in May.

## 2013-08-25 ENCOUNTER — Telehealth: Payer: Self-pay | Admitting: Family Medicine

## 2013-08-25 NOTE — Telephone Encounter (Signed)
Patient came in last week and given an antibiotic due to sinus infection. When he used his C-pap machine last night his symptoms seem to have gotten worse. He routinely cleans the hose on the machine. Now, it feels like his sinuses are inflamed and he is stopped up. He has two days left of antibiotics. He wants to know how long he should not use the c-pap machine for. Please advise.

## 2013-08-25 NOTE — Telephone Encounter (Signed)
Patient notified

## 2013-08-25 NOTE — Telephone Encounter (Signed)
Hold off for one wk more

## 2013-09-15 ENCOUNTER — Telehealth: Payer: Self-pay | Admitting: Family Medicine

## 2013-09-15 MED ORDER — PREDNISONE 20 MG PO TABS: ORAL_TABLET | ORAL | Status: DC

## 2013-09-15 NOTE — Telephone Encounter (Signed)
NTC

## 2013-09-15 NOTE — Telephone Encounter (Signed)
Patient is still having trouble with his sinuses. He took some benadryl and had drainage, but started having severe stomach pains.  He said he started taking the hycoscamine, but he couldn't use the C-pap machine last night. Should he stay off of the c-pap machine longer?

## 2013-09-15 NOTE — Telephone Encounter (Signed)
Notified patient it is fine for right now not to use the CPAP machine. He might have to hold off on CPAP machine until the ENT get him breathing better through his nose. He could also use a short course of steroids to help with this. This can often help reduce the allergy aspects. If he is in agreement for this would recommend prednisone 20 mg 3 daily for 2 days then 2 a day for 2 days then one a day for 2 days. Followup if any problems. Medication was sent to pharmacy. Patient verbalized understanding.

## 2013-09-15 NOTE — Telephone Encounter (Signed)
Patient states that he has been having drainage, congestion, slight cough and chills. No SOB, fever or wheezing noted. Patient states that he has abdominal cramps and nausea due to the sinus drainage. Hycoscamine works for this. He is taking loratadine and a nasal spray daily. Has an appointment on May 7 with ENT as recommended.

## 2013-09-15 NOTE — Telephone Encounter (Signed)
It is fine for right now not to use the CPAP machine. He might have to hold off on CPAP machine until the ENT get him breathing better through his nose. He could also use a short course of steroids to help with this. This can often help reduce the allergy aspects. If he is in agreement for this I would recommend prednisone 20 mg 3 daily for 2 days then 2 a day for 2 days then one a day for 2 days. Followup if any problems.

## 2013-10-01 ENCOUNTER — Ambulatory Visit (INDEPENDENT_AMBULATORY_CARE_PROVIDER_SITE_OTHER): Payer: PRIVATE HEALTH INSURANCE | Admitting: Otolaryngology

## 2013-10-01 DIAGNOSIS — J342 Deviated nasal septum: Secondary | ICD-10-CM

## 2013-10-01 DIAGNOSIS — J343 Hypertrophy of nasal turbinates: Secondary | ICD-10-CM

## 2013-10-05 ENCOUNTER — Other Ambulatory Visit (INDEPENDENT_AMBULATORY_CARE_PROVIDER_SITE_OTHER): Payer: Self-pay | Admitting: Otolaryngology

## 2013-10-05 DIAGNOSIS — J329 Chronic sinusitis, unspecified: Secondary | ICD-10-CM

## 2013-10-07 ENCOUNTER — Ambulatory Visit (HOSPITAL_COMMUNITY)
Admission: RE | Admit: 2013-10-07 | Discharge: 2013-10-07 | Disposition: A | Discharge status: Home / Self Care | Payer: PRIVATE HEALTH INSURANCE | Source: Ambulatory Visit | Attending: Otolaryngology | Admitting: Otolaryngology

## 2013-10-07 DIAGNOSIS — J329 Chronic sinusitis, unspecified: Secondary | ICD-10-CM | POA: Insufficient documentation

## 2013-10-07 DIAGNOSIS — J342 Deviated nasal septum: Secondary | ICD-10-CM | POA: Insufficient documentation

## 2013-10-22 ENCOUNTER — Ambulatory Visit (INDEPENDENT_AMBULATORY_CARE_PROVIDER_SITE_OTHER): Payer: PRIVATE HEALTH INSURANCE | Admitting: Otolaryngology

## 2013-10-22 DIAGNOSIS — J342 Deviated nasal septum: Secondary | ICD-10-CM

## 2013-10-22 DIAGNOSIS — J343 Hypertrophy of nasal turbinates: Secondary | ICD-10-CM

## 2013-10-26 ENCOUNTER — Other Ambulatory Visit: Payer: Self-pay | Admitting: Otolaryngology

## 2013-10-29 ENCOUNTER — Encounter (HOSPITAL_COMMUNITY)
Admission: RE | Admit: 2013-10-29 | Discharge: 2013-10-29 | Disposition: A | Discharge status: Home / Self Care | Payer: PRIVATE HEALTH INSURANCE | Source: Ambulatory Visit | Attending: Otolaryngology | Admitting: Otolaryngology

## 2013-10-29 ENCOUNTER — Encounter (HOSPITAL_BASED_OUTPATIENT_CLINIC_OR_DEPARTMENT_OTHER): Payer: Self-pay | Admitting: *Deleted

## 2013-10-29 DIAGNOSIS — Z01812 Encounter for preprocedural laboratory examination: Secondary | ICD-10-CM | POA: Insufficient documentation

## 2013-10-29 DIAGNOSIS — Z01810 Encounter for preprocedural cardiovascular examination: Secondary | ICD-10-CM | POA: Insufficient documentation

## 2013-10-29 LAB — BASIC METABOLIC PANEL
BUN: 17 mg/dL (ref 6–23)
CALCIUM: 9.7 mg/dL (ref 8.4–10.5)
CO2: 28 mEq/L (ref 19–32)
Chloride: 104 mEq/L (ref 96–112)
Creatinine, Ser: 0.83 mg/dL (ref 0.50–1.35)
GFR calc Af Amer: >90 mL/min (ref >90)
GFR calc non Af Amer: >90 mL/min (ref >90)
Glucose, Bld: 87 mg/dL (ref 70–99)
Potassium: 4.4 mEq/L (ref 3.7–5.3)
SODIUM: 144 meq/L (ref 137–147)

## 2013-10-29 NOTE — Progress Notes (Signed)
Will go to [facility] for ekg-bmet-uses a cpap-nose mask-able to use now after prednisone taper-to bring all meds and cpap and overnight bag

## 2013-10-29 NOTE — Progress Notes (Signed)
Reviewed with dr Fitzgerald-need to be done at [facility] or due to sleep apnea and airway -pt cpap is a nasal mask Pt notified-dr teoh notified

## 2013-11-03 ENCOUNTER — Ambulatory Visit (HOSPITAL_BASED_OUTPATIENT_CLINIC_OR_DEPARTMENT_OTHER)
Admission: RE | Admit: 2013-11-03 | Payer: PRIVATE HEALTH INSURANCE | Source: Ambulatory Visit | Admitting: Otolaryngology

## 2013-11-03 HISTORY — DX: Adverse effect of unspecified anesthetic, initial encounter: T41.45XA

## 2013-11-03 HISTORY — DX: Other complications of anesthesia, initial encounter: T88.59XA

## 2013-11-03 HISTORY — DX: Encounter for fitting and adjustment of spectacles and contact lenses: Z46.0

## 2013-11-03 HISTORY — DX: Sleep apnea, unspecified: G47.30

## 2013-11-03 HISTORY — DX: Other seasonal allergic rhinitis: J30.2

## 2013-11-03 SURGERY — SEPTOPLASTY, NOSE
Anesthesia: General

## 2013-11-05 ENCOUNTER — Telehealth: Payer: Self-pay | Admitting: Family Medicine

## 2013-11-05 NOTE — Telephone Encounter (Signed)
error 

## 2013-11-16 ENCOUNTER — Encounter (HOSPITAL_COMMUNITY): Payer: Self-pay

## 2013-11-17 ENCOUNTER — Encounter (HOSPITAL_COMMUNITY): Payer: Self-pay

## 2013-11-17 ENCOUNTER — Encounter (HOSPITAL_COMMUNITY)
Admission: RE | Admit: 2013-11-17 | Discharge: 2013-11-17 | Disposition: A | Discharge status: Home / Self Care | Payer: PRIVATE HEALTH INSURANCE | Source: Ambulatory Visit | Attending: Otolaryngology | Admitting: Otolaryngology

## 2013-11-17 ENCOUNTER — Ambulatory Visit (HOSPITAL_COMMUNITY)
Admission: RE | Admit: 2013-11-17 | Discharge: 2013-11-17 | Disposition: A | Discharge status: Home / Self Care | Payer: PRIVATE HEALTH INSURANCE | Source: Ambulatory Visit | Attending: Anesthesiology | Admitting: Anesthesiology

## 2013-11-17 ENCOUNTER — Encounter (HOSPITAL_COMMUNITY): Payer: Self-pay | Admitting: *Deleted

## 2013-11-17 DIAGNOSIS — I10 Essential (primary) hypertension: Secondary | ICD-10-CM | POA: Insufficient documentation

## 2013-11-17 HISTORY — DX: Nausea with vomiting, unspecified: R11.2

## 2013-11-17 HISTORY — DX: Other specified postprocedural states: Z98.890

## 2013-11-17 LAB — CBC
HCT: 39.1 % (ref 39.0–52.0)
Hemoglobin: 13.6 g/dL (ref 13.0–17.0)
MCH: 29.2 pg (ref 26.0–34.0)
MCHC: 34.8 g/dL (ref 30.0–36.0)
MCV: 84.1 fL (ref 78.0–100.0)
PLATELETS: 211 10*3/uL (ref 150–400)
RBC: 4.65 MIL/uL (ref 4.22–5.81)
RDW: 13.1 % (ref 11.5–15.5)
WBC: 7.7 10*3/uL (ref 4.0–10.5)

## 2013-11-17 LAB — BASIC METABOLIC PANEL
BUN: 22 mg/dL (ref 6–23)
CHLORIDE: 107 meq/L (ref 96–112)
CO2: 27 mEq/L (ref 19–32)
CREATININE: 0.9 mg/dL (ref 0.50–1.35)
Calcium: 9.9 mg/dL (ref 8.4–10.5)
GFR calc Af Amer: >90 mL/min (ref >90)
GFR calc non Af Amer: >90 mL/min (ref >90)
Glucose, Bld: 85 mg/dL (ref 70–99)
POTASSIUM: 4.9 meq/L (ref 3.7–5.3)
Sodium: 146 mEq/L (ref 137–147)

## 2013-11-17 NOTE — Progress Notes (Signed)
BP per [employer] 190/104, rechecked manually 170/80.  Mr Mitchell Holden states that he was on Lisonopril 5mg , but it made him dizzy and severe leg pain, so he stopped it.  Patient reports that he has white coat syndrome and that he has been checking it at home, records 130/78-145/85.  Mr Mitchell Holden states that he has an appointment with medical MD in July.

## 2013-11-17 NOTE — Pre-Procedure Instructions (Signed)
Claudina LickMarvin J Nicoll III  11/17/2013   Your procedure is scheduled on:  Wednesday, July 1.  [facility].  Call this number if you have problems the morning of surgery: [phone]   Remember:   Do not eat food or drink liquids after midnight Tuesday,June 30.   Take these medicines the morning of surgery with A SIP OF WATER: levothyroxine (SYNTHROID,  LEVOTHROID.             Stop taking all herbal medications, vitamins, aspirin, aspirin products, ibuprofen, naproxen (aleve) on June 24.    Do not wear jewelry, make-up or nail polish.  Do not wear lotions, powders, or perfume.  Men may shave face and neck.  Do not bring valuables to the hospital.               [facility] for any belongings or valuables.               Contacts, dentures or bridgework may not be worn into surgery.  Leave suitcase in the car. After surgery it may be brought to your room.  For patients admitted to the hospital, discharge time is determined by your treatment team.               Patients discharged the day of surgery will not be allowed to drive home.  Name and phone number of your driver: -   Special Instructions: Review  [place] - Preparing For Surgery.   Please read over the following fact sheets that you were given: Pain Booklet, Coughing and Deep Breathing and Surgical Site Infection Prevention

## 2013-11-24 MED ORDER — MIDAZOLAM HCL 2 MG/2ML IJ SOLN: 1.0000 mg | INTRAMUSCULAR | Status: DC | PRN

## 2013-11-24 MED ORDER — FENTANYL CITRATE 0.05 MG/ML IJ SOLN: 50.0000 ug | INTRAMUSCULAR | Status: DC | PRN

## 2013-11-24 MED ORDER — LACTATED RINGERS IV SOLN
INTRAVENOUS | Status: DC
Start: 2013-11-24 — End: 2013-11-25

## 2013-11-25 ENCOUNTER — Ambulatory Visit (HOSPITAL_COMMUNITY)
Admission: RE | Admit: 2013-11-25 | Discharge: 2013-11-25 | Disposition: A | Discharge status: Home / Self Care | Payer: PRIVATE HEALTH INSURANCE | Source: Ambulatory Visit | Attending: Otolaryngology | Admitting: Otolaryngology

## 2013-11-25 ENCOUNTER — Encounter (HOSPITAL_COMMUNITY): Payer: Self-pay | Admitting: *Deleted

## 2013-11-25 ENCOUNTER — Encounter (HOSPITAL_COMMUNITY)
Admission: RE | Disposition: A | Discharge status: Home / Self Care | Payer: Self-pay | Source: Ambulatory Visit | Attending: Otolaryngology

## 2013-11-25 ENCOUNTER — Encounter (HOSPITAL_COMMUNITY): Payer: PRIVATE HEALTH INSURANCE | Admitting: Anesthesiology

## 2013-11-25 ENCOUNTER — Ambulatory Visit (HOSPITAL_COMMUNITY): Payer: PRIVATE HEALTH INSURANCE | Admitting: Anesthesiology

## 2013-11-25 DIAGNOSIS — Z6832 Body mass index (BMI) 32.0-32.9, adult: Secondary | ICD-10-CM | POA: Insufficient documentation

## 2013-11-25 DIAGNOSIS — J3489 Other specified disorders of nose and nasal sinuses: Secondary | ICD-10-CM | POA: Insufficient documentation

## 2013-11-25 DIAGNOSIS — J343 Hypertrophy of nasal turbinates: Secondary | ICD-10-CM | POA: Insufficient documentation

## 2013-11-25 DIAGNOSIS — I10 Essential (primary) hypertension: Secondary | ICD-10-CM | POA: Insufficient documentation

## 2013-11-25 DIAGNOSIS — J342 Deviated nasal septum: Secondary | ICD-10-CM

## 2013-11-25 DIAGNOSIS — E039 Hypothyroidism, unspecified: Secondary | ICD-10-CM | POA: Insufficient documentation

## 2013-11-25 DIAGNOSIS — G4730 Sleep apnea, unspecified: Secondary | ICD-10-CM | POA: Insufficient documentation

## 2013-11-25 HISTORY — DX: Unspecified hemorrhoids: K64.9

## 2013-11-25 HISTORY — PX: NASAL SEPTOPLASTY W/ TURBINOPLASTY: SHX2070

## 2013-11-25 HISTORY — PX: TURBINATE REDUCTION: SHX6157

## 2013-11-25 HISTORY — DX: Hypothyroidism, unspecified: E03.9

## 2013-11-25 HISTORY — DX: Pneumonia, unspecified organism: J18.9

## 2013-11-25 SURGERY — SEPTOPLASTY, NOSE, WITH NASAL TURBINATE REDUCTION
Anesthesia: General | Site: Nose

## 2013-11-25 MED ORDER — FENTANYL CITRATE 0.05 MG/ML IJ SOLN
INTRAMUSCULAR | Status: DC | PRN
  Administered 2013-11-25: 100 ug via INTRAVENOUS
  Administered 2013-11-25 (×2): 50 ug via INTRAVENOUS

## 2013-11-25 MED ORDER — BACITRACIN ZINC 500 UNIT/GM EX OINT
TOPICAL_OINTMENT | CUTANEOUS | Status: DC | PRN
  Administered 2013-11-25: 1 via TOPICAL

## 2013-11-25 MED ORDER — GLYCOPYRROLATE 0.2 MG/ML IJ SOLN
INTRAMUSCULAR | Status: AC
  Filled 2013-11-25: qty 4

## 2013-11-25 MED ORDER — OXYCODONE HCL 5 MG PO TABS: 5.0000 mg | ORAL_TABLET | Freq: Once | ORAL | Status: DC | PRN

## 2013-11-25 MED ORDER — OXYCODONE HCL 5 MG/5ML PO SOLN: 5.0000 mg | Freq: Once | ORAL | Status: DC | PRN

## 2013-11-25 MED ORDER — PHENYLEPHRINE HCL 10 MG/ML IJ SOLN
INTRAMUSCULAR | Status: DC | PRN
  Administered 2013-11-25 (×2): 80 ug via INTRAVENOUS
  Administered 2013-11-25: 40 ug via INTRAVENOUS

## 2013-11-25 MED ORDER — FENTANYL CITRATE 0.05 MG/ML IJ SOLN
INTRAMUSCULAR | Status: AC
  Filled 2013-11-25: qty 5

## 2013-11-25 MED ORDER — METOCLOPRAMIDE HCL 5 MG/ML IJ SOLN
INTRAMUSCULAR | Status: DC | PRN
  Administered 2013-11-25: 10 mg via INTRAVENOUS

## 2013-11-25 MED ORDER — METOCLOPRAMIDE HCL 5 MG/ML IJ SOLN
INTRAMUSCULAR | Status: AC
  Filled 2013-11-25: qty 2

## 2013-11-25 MED ORDER — NEOSTIGMINE METHYLSULFATE 10 MG/10ML IV SOLN
INTRAVENOUS | Status: AC
  Filled 2013-11-25: qty 1

## 2013-11-25 MED ORDER — DEXAMETHASONE SODIUM PHOSPHATE 10 MG/ML IJ SOLN
INTRAMUSCULAR | Status: AC
  Filled 2013-11-25: qty 1

## 2013-11-25 MED ORDER — MIDAZOLAM HCL 2 MG/2ML IJ SOLN
INTRAMUSCULAR | Status: AC
  Filled 2013-11-25: qty 2

## 2013-11-25 MED ORDER — LACTATED RINGERS IV SOLN
INTRAVENOUS | Status: DC
  Administered 2013-11-25: 09:00:00 via INTRAVENOUS

## 2013-11-25 MED ORDER — OXYMETAZOLINE HCL 0.05 % NA SOLN
NASAL | Status: AC
  Filled 2013-11-25: qty 15

## 2013-11-25 MED ORDER — GLYCOPYRROLATE 0.2 MG/ML IJ SOLN
INTRAMUSCULAR | Status: DC | PRN
  Administered 2013-11-25: .8 mg via INTRAVENOUS

## 2013-11-25 MED ORDER — PHENYLEPHRINE 40 MCG/ML (10ML) SYRINGE FOR IV PUSH (FOR BLOOD PRESSURE SUPPORT)
PREFILLED_SYRINGE | INTRAVENOUS | Status: AC
  Filled 2013-11-25: qty 10

## 2013-11-25 MED ORDER — METOCLOPRAMIDE HCL 5 MG/ML IJ SOLN: 10.0000 mg | Freq: Once | INTRAMUSCULAR | Status: DC | PRN

## 2013-11-25 MED ORDER — PROPOFOL 10 MG/ML IV BOLUS
INTRAVENOUS | Status: AC
  Filled 2013-11-25: qty 20

## 2013-11-25 MED ORDER — NEOSTIGMINE METHYLSULFATE 10 MG/10ML IV SOLN
INTRAVENOUS | Status: AC
  Filled 2013-11-25: qty 2

## 2013-11-25 MED ORDER — DEXAMETHASONE SODIUM PHOSPHATE 10 MG/ML IJ SOLN
INTRAMUSCULAR | Status: DC | PRN
  Administered 2013-11-25: 10 mg via INTRAVENOUS

## 2013-11-25 MED ORDER — LIDOCAINE HCL (CARDIAC) 20 MG/ML IV SOLN
INTRAVENOUS | Status: AC
  Filled 2013-11-25: qty 5

## 2013-11-25 MED ORDER — OXYMETAZOLINE HCL 0.05 % NA SOLN
NASAL | Status: DC | PRN
  Administered 2013-11-25: 1

## 2013-11-25 MED ORDER — PROPOFOL 10 MG/ML IV BOLUS
INTRAVENOUS | Status: DC | PRN
  Administered 2013-11-25: 200 mg via INTRAVENOUS

## 2013-11-25 MED ORDER — 0.9 % SODIUM CHLORIDE (POUR BTL) OPTIME
TOPICAL | Status: DC | PRN
  Administered 2013-11-25: 1000 mL

## 2013-11-25 MED ORDER — OXYMETAZOLINE HCL 0.05 % NA SOLN
NASAL | Status: DC | PRN
Start: 2013-11-25 — End: 2013-11-25
  Administered 2013-11-25: 2 via NASAL

## 2013-11-25 MED ORDER — LIDOCAINE-EPINEPHRINE 1 %-1:100000 IJ SOLN
INTRAMUSCULAR | Status: AC
  Filled 2013-11-25: qty 1

## 2013-11-25 MED ORDER — ROCURONIUM BROMIDE 100 MG/10ML IV SOLN
INTRAVENOUS | Status: DC | PRN
  Administered 2013-11-25: 50 mg via INTRAVENOUS

## 2013-11-25 MED ORDER — ONDANSETRON HCL 4 MG/2ML IJ SOLN
INTRAMUSCULAR | Status: AC
  Filled 2013-11-25: qty 2

## 2013-11-25 MED ORDER — LIDOCAINE HCL (CARDIAC) 20 MG/ML IV SOLN
INTRAVENOUS | Status: DC | PRN
  Administered 2013-11-25: 100 mg via INTRAVENOUS

## 2013-11-25 MED ORDER — ROCURONIUM BROMIDE 50 MG/5ML IV SOLN
INTRAVENOUS | Status: AC
  Filled 2013-11-25: qty 1

## 2013-11-25 MED ORDER — MIDAZOLAM HCL 5 MG/5ML IJ SOLN
INTRAMUSCULAR | Status: DC | PRN
  Administered 2013-11-25: 2 mg via INTRAVENOUS

## 2013-11-25 MED ORDER — BACITRACIN ZINC 500 UNIT/GM EX OINT
TOPICAL_OINTMENT | CUTANEOUS | Status: AC
  Filled 2013-11-25: qty 15

## 2013-11-25 MED ORDER — NEOSTIGMINE METHYLSULFATE 10 MG/10ML IV SOLN
INTRAVENOUS | Status: DC | PRN
  Administered 2013-11-25: 5 mg via INTRAVENOUS

## 2013-11-25 MED ORDER — FENTANYL CITRATE 0.05 MG/ML IJ SOLN: 25.0000 ug | INTRAMUSCULAR | Status: DC | PRN

## 2013-11-25 MED ORDER — LACTATED RINGERS IV SOLN
INTRAVENOUS | Status: DC | PRN
  Administered 2013-11-25 (×2): via INTRAVENOUS

## 2013-11-25 MED ORDER — ONDANSETRON HCL 4 MG/2ML IJ SOLN
INTRAMUSCULAR | Status: DC | PRN
  Administered 2013-11-25: 4 mg via INTRAVENOUS

## 2013-11-25 MED ORDER — OXYCODONE-ACETAMINOPHEN 5-325 MG PO TABS: 1.0000 | ORAL_TABLET | ORAL | Status: DC | PRN

## 2013-11-25 MED ORDER — LIDOCAINE-EPINEPHRINE 1 %-1:100000 IJ SOLN
INTRAMUSCULAR | Status: DC | PRN
  Administered 2013-11-25: 20 mL

## 2013-11-25 SURGICAL SUPPLY — 35 items
BLADE 10 SAFETY STRL DISP (BLADE) IMPLANT
CANISTER SUCTION 2500CC (MISCELLANEOUS) ×4 IMPLANT
COAGULATOR SUCT 6 FR SWTCH (ELECTROSURGICAL) ×1
COAGULATOR SUCT SWTCH 10FR 6 (ELECTROSURGICAL) ×3 IMPLANT
DECANTER SPIKE VIAL GLASS SM (MISCELLANEOUS) IMPLANT
DRAPE CAMERA CLOSED 9X96 (DRAPES) IMPLANT
ELECT REM PT RETURN 9FT ADLT (ELECTROSURGICAL) ×4
ELECTRODE REM PT RTRN 9FT ADLT (ELECTROSURGICAL) ×2 IMPLANT
GAUZE SPONGE 2X2 8PLY STRL LF (GAUZE/BANDAGES/DRESSINGS) IMPLANT
GLOVE BIOGEL PI IND STRL 7.0 (GLOVE) ×2 IMPLANT
GLOVE BIOGEL PI INDICATOR 7.0 (GLOVE) ×2
GLOVE ECLIPSE 7.5 STRL STRAW (GLOVE) ×4 IMPLANT
GLOVE SURG SS PI 7.0 STRL IVOR (GLOVE) ×4 IMPLANT
GOWN STRL REUS W/ TWL LRG LVL3 (GOWN DISPOSABLE) ×4 IMPLANT
GOWN STRL REUS W/TWL LRG LVL3 (GOWN DISPOSABLE) ×4
KIT BASIN OR (CUSTOM PROCEDURE TRAY) ×4 IMPLANT
KIT ROOM TURNOVER OR (KITS) ×4 IMPLANT
NS IRRIG 1000ML POUR BTL (IV SOLUTION) ×4 IMPLANT
PAD ARMBOARD 7.5X6 YLW CONV (MISCELLANEOUS) ×4 IMPLANT
SOLUTION ANTI FOG 6CC (MISCELLANEOUS) ×4 IMPLANT
SPLINT NASAL DOYLE BI-VL (GAUZE/BANDAGES/DRESSINGS) ×4 IMPLANT
SPONGE GAUZE 2X2 STER 10/PKG (GAUZE/BANDAGES/DRESSINGS)
SPONGE NEURO XRAY DETECT 1X3 (DISPOSABLE) ×4 IMPLANT
SUT CHROMIC 3 0 SH 27 (SUTURE) ×4 IMPLANT
SUT CHROMIC 4 0 PS 2 18 (SUTURE) ×4 IMPLANT
SUT CHROMIC 4 0 S 4 (SUTURE) IMPLANT
SUT PLAIN 4 0 ~~LOC~~ 1 (SUTURE) ×4 IMPLANT
SUT PROLENE 2 0 CT2 30 (SUTURE) ×4 IMPLANT
SUT PROLENE 2 0 FS (SUTURE) IMPLANT
TOWEL OR 17X24 6PK STRL BLUE (TOWEL DISPOSABLE) ×4 IMPLANT
TOWEL OR 17X26 10 PK STRL BLUE (TOWEL DISPOSABLE) ×4 IMPLANT
TRAY ENT MC OR (CUSTOM PROCEDURE TRAY) ×4 IMPLANT
TUBE SALEM SUMP 16 FR W/ARV (TUBING) ×4 IMPLANT
TUBING EXTENTION W/L.L. (IV SETS) IMPLANT
WATER STERILE IRR 1000ML POUR (IV SOLUTION) IMPLANT

## 2013-11-25 NOTE — Op Note (Signed)
DATE OF PROCEDURE: 11/25/2013  OPERATIVE REPORT   SURGEON: Newman PiesSu Rhemi Balbach, MD   PREOPERATIVE DIAGNOSES:  1. Severe nasal septal deviation.  2. Left inferior turbinate hypertrophy.  3. Chronic nasal obstruction.  POSTOPERATIVE DIAGNOSES:  1. Severe nasal septal deviation.  2. Bilateral inferior turbinate hypertrophy.  3. Chronic nasal obstruction.  PROCEDURE PERFORMED:  1. Septoplasty.  2. Bilateral partial inferior turbinate resection.   ANESTHESIA: General endotracheal tube anesthesia.   COMPLICATIONS: None.   ESTIMATED BLOOD LOSS: Less than 50 mL.   INDICATION FOR PROCEDURE: Mitchell Holden is a 53 y.o. male with a history of chronic nasal obstruction. The patient was  treated with antihistamine, decongestant, steroid nasal spray, and systemic steroids. However, the patient continues to be symptomatic. On examination, the patient was noted to have left inferior turbinate hypertrophy and significant nasal septal deviation, causing significant nasal obstruction. Based on the above findings, the decision was made for the patient to undergo the septoplasty and left turbinate reduction procedure. Intraoperatively, the right posterior inferior turbinate was also noted to be significantly hypertrophied.  The decision was made to perform bilateral inferior turbinate reduction. The risks, benefits, alternatives, and details of the procedure were discussed with the patient. Questions were invited and answered. Informed consent was obtained.   DESCRIPTION OF PROCEDURE: The patient was taken to the operating room and placed supine on the operating table. General endotracheal tube anesthesia was administered by the anesthesiologist. The patient was positioned, and prepped and draped in the standard fashion for nasal surgery. Pledgets soaked with Afrin were placed in both nasal cavities for decongestion. The pledgets were subsequently removed. The above mentioned severe septal deviation was again noted.  1% lidocaine with 1:100,000 epinephrine was injected onto the nasal septum bilaterally. A hemitransfixion incision was made on the left side. The mucosal flap was carefully elevated on the left side. A cartilaginous incision was made 1 cm superior to the caudal margin of the nasal septum. Mucosal flap was also elevated on the right side in the similar fashion. It should be noted that due to the severe septal deviation, the deviated portion of the cartilaginous and bony septum had to be removed in piecemeal fashion. Once the deviated portions were removed, a straight midline septum was achieved. The septum was then quilted with 4-0 plain gut sutures. The hemitransfixion incision was closed with interrupted 4-0 chromic sutures. Doyle splints were applied.   Prior to the [facility] splint application, the inferior one half of both hypertrophied inferior turbinate was crossclamped with a Kelly clamp. The inferior one half of each inferior turbinate was then resected with a pair of cross cutting scissors. Hemostasis was achieved with a suction cautery device.   The care of the patient was turned over to the anesthesiologist. The patient was awakened from anesthesia without difficulty. The patient was extubated and transferred to the recovery room in good condition.   OPERATIVE FINDINGS: Severe nasal septal deviation and bilateral inferior turbinate hypertrophy.   SPECIMEN: None.   FOLLOWUP CARE: The patient be discharged home once he is awake and alert. The patient will be placed on Percocet 1-2 tablets p.o. q.6 hours p.r.n. pain. The patient will follow up in my office in approximately 1 week for splint removal.   Iyana Topor Philomena DohenyWooi Breleigh Carpino, MD

## 2013-11-25 NOTE — H&P (Signed)
Cc: Chronic nasal obstruction, septal deviation, inferior turbinate hypertrophy  The patient is a 53 year old male who returns today for his follow-up evaluation.  The patient was seen 3 weeks ago.  At that time, he was noted to have bilateral chronic nasal obstruction secondary to his severe septal deviation and turbinate hypertrophy.  He underwent a paranasal sinus CT scan.  No sinus disease was noted.  The CT shows a severe nasal septal deviation to the right, with left inferior turbinate hypertrophy. The patient was previously treated with topical and oral steroids, antihistamine, and decongestant, without improvement in his symptoms.  The patient returns reporting no change in his symptoms.  He is still a habitual mouth breather.  He also has significant difficulty using his CPAP due to his bilateral nasal obstruction.  He is interested in more definitive treatment of his nasal condition. No other ENT, GI, or respiratory issue noted since the last visit.   Exam: General: Communicates without difficulty, well nourished, no acute distress.  Head: Normocephalic, no evidence injury, no tenderness, facial buttresses intact without stepoff.  Eyes: PERRL, EOMI. No scleral icterus, conjunctivae clear.  Neuro: CN II exam reveals vision grossly intact.  No nystagmus at any point of gaze.  Ears: Auricles well formed without lesions.  Ear canals are intact without mass or lesion.  No erythema or edema is appreciated.  The TMs are intact without fluid.  Nose: External evaluation reveals normal support and skin without lesions.  Dorsum is intact. Nasal dorsal deformity is noted.  Anterior rhinoscopy reveals congested and edematous mucosa over anterior aspect of the inferior turbinates and nasal septum.  No purulence is noted.  Bidirectional septal deviation, worse on the right side. The left turbinate is severely hypertrophied.  Oral:  Oral cavity and oropharynx are intact, symmetric, without erythema or edema.  Mucosa  is moist without lesions.  Neck: Full range of motion without pain.  There is no significant lymphadenopathy.  No masses palpable.  Thyroid bed within normal limits to palpation.  Parotid glands and submandibular glands equal bilaterally without mass.  Trachea is midline.  Neuro:  CN 2-12 grossly intact. Gait normal.   Assessment 1.  Severe bidirectional nasal septal deviation and left inferior turbinate hypertrophy.  Nearly 100% of his nasal airway is obstructed bilaterally.  2.  No paranasal sinus disease is noted.   Plan 1.  The physical exam findings and the CT images are reviewed with the patient.  2.  Based on the above findings, the patient will likely benefit from undergoing the septoplasty and left turbinate reduction procedures.  The risks, benefits, alternatives and details of the procedure are reviewed with the patient.  Questions were invited and answered.   3.  The patient would like to proceed.  We will schedule the procedures in accordance with the patient's schedule.

## 2013-11-25 NOTE — Discharge Instructions (Signed)

## 2013-11-25 NOTE — Anesthesia Preprocedure Evaluation (Addendum)
Anesthesia Evaluation  Patient identified by MRN, date of birth, ID band Patient awake    Reviewed: Allergy & Precautions, H&P , NPO status , Patient's Chart, lab work & pertinent test results, reviewed documented beta blocker date and time   History of Anesthesia Complications (+) PONV, PROLONGED EMERGENCE and history of anesthetic complications  Airway Mallampati: II TM Distance: >3 FB Neck ROM: full    Dental  (+) Teeth Intact, Dental Advisory Given   Pulmonary neg pulmonary ROS, sleep apnea , pneumonia -, resolved,  breath sounds clear to auscultation        Cardiovascular hypertension, negative cardio ROS  Rhythm:regular     Neuro/Psych negative neurological ROS  negative psych ROS   GI/Hepatic negative GI ROS, Neg liver ROS,   Endo/Other  Hypothyroidism Morbid obesity  Renal/GU negative Renal ROS  negative genitourinary   Musculoskeletal   Abdominal   Peds  Hematology negative hematology ROS (+)   Anesthesia Other Findings See surgeon's H&P   Reproductive/Obstetrics negative OB ROS                          Anesthesia Physical Anesthesia Plan  ASA: III  Anesthesia Plan: General   Post-op Pain Management:    Induction: Intravenous  Airway Management Planned: Oral ETT and Video Laryngoscope Planned  Additional Equipment:   Intra-op Plan:   Post-operative Plan: Extubation in OR  Informed Consent: I have reviewed the patients History and Physical, chart, labs and discussed the procedure including the risks, benefits and alternatives for the proposed anesthesia with the patient or authorized representative who has indicated his/her understanding and acceptance.   Dental Advisory Given  Plan Discussed with: CRNA and Surgeon  Anesthesia Plan Comments:         Anesthesia Quick Evaluation

## 2013-11-25 NOTE — Transfer of Care (Signed)
Immediate Anesthesia Transfer of Care Note  Patient: Mitchell Holden  Procedure(s) Performed: Procedure(s): SEPTOPLASTY (N/A) BILATERAL TURBINATE REDUCTION (Bilateral)  Patient Location: PACU  Anesthesia Type:General  Level of Consciousness: awake, alert  and oriented  Airway & Oxygen Therapy: Patient Spontanous Breathing and Patient connected to face mask oxygen  Post-op Assessment: Report given to PACU RN, Post -op Vital signs reviewed and stable and Patient moving all extremities X 4  Post vital signs: Reviewed and stable  Complications: No apparent anesthesia complications

## 2013-11-25 NOTE — Anesthesia Procedure Notes (Signed)
Procedure Name: Intubation Date/Time: 11/25/2013 10:13 AM Performed by: Vita BarleyURNER, Virgie Kunda E Pre-anesthesia Checklist: Patient identified, Emergency Drugs available, Suction available and Patient being monitored Patient Re-evaluated:Patient Re-evaluated prior to inductionOxygen Delivery Method: Circle system utilized Preoxygenation: Pre-oxygenation with 100% oxygen Intubation Type: IV induction Ventilation: Mask ventilation without difficulty and Oral airway inserted - appropriate to patient size Grade View: Grade I Tube type: Oral Tube size: 7.5 mm Number of attempts: 1 Airway Equipment and Method: Video-laryngoscopy and Oral airway Placement Confirmation: ETT inserted through vocal cords under direct vision,  positive ETCO2 and breath sounds checked- equal and bilateral Secured at: 23 cm Tube secured with: Tape Dental Injury: Teeth and Oropharynx as per pre-operative assessment

## 2013-11-25 NOTE — Anesthesia Postprocedure Evaluation (Signed)
Anesthesia Post Note  Patient: Mitchell Holden  Procedure(s) Performed: Procedure(s) (LRB): SEPTOPLASTY (N/A) BILATERAL TURBINATE REDUCTION (Bilateral)  Anesthesia type: General  Patient location: PACU  Post pain: Pain level controlled  Post assessment: Patient's Cardiovascular Status Stable  Last Vitals:  Filed Vitals:   11/25/13 1223  BP: 152/83  Pulse: 68  Temp:   Resp: 15    Post vital signs: Reviewed and stable  Level of consciousness: alert  Complications: No apparent anesthesia complications

## 2013-11-30 ENCOUNTER — Encounter (HOSPITAL_COMMUNITY): Payer: Self-pay | Admitting: Otolaryngology

## 2013-12-04 ENCOUNTER — Other Ambulatory Visit: Payer: Self-pay

## 2013-12-04 DIAGNOSIS — E039 Hypothyroidism, unspecified: Secondary | ICD-10-CM

## 2013-12-04 DIAGNOSIS — E785 Hyperlipidemia, unspecified: Secondary | ICD-10-CM

## 2013-12-04 DIAGNOSIS — Z79899 Other long term (current) drug therapy: Secondary | ICD-10-CM

## 2013-12-04 DIAGNOSIS — Z125 Encounter for screening for malignant neoplasm of prostate: Secondary | ICD-10-CM

## 2013-12-10 ENCOUNTER — Ambulatory Visit (INDEPENDENT_AMBULATORY_CARE_PROVIDER_SITE_OTHER): Payer: PRIVATE HEALTH INSURANCE | Admitting: Otolaryngology

## 2013-12-10 LAB — BASIC METABOLIC PANEL
BUN: 18 mg/dL (ref 6–23)
CO2: 28 mEq/L (ref 19–32)
CREATININE: 0.89 mg/dL (ref 0.50–1.35)
Calcium: 9.7 mg/dL (ref 8.4–10.5)
Chloride: 106 mEq/L (ref 96–112)
Glucose, Bld: 87 mg/dL (ref 70–99)
Potassium: 4.5 mEq/L (ref 3.5–5.3)
Sodium: 141 mEq/L (ref 135–145)

## 2013-12-10 LAB — HEPATIC FUNCTION PANEL
ALK PHOS: 76 U/L (ref 39–117)
ALT: 42 U/L (ref 0–53)
AST: 27 U/L (ref 0–37)
Albumin: 4.4 g/dL (ref 3.5–5.2)
Bilirubin, Direct: 0.1 mg/dL (ref 0.0–0.3)
Indirect Bilirubin: 0.4 mg/dL (ref 0.2–1.2)
TOTAL PROTEIN: 7 g/dL (ref 6.0–8.3)
Total Bilirubin: 0.5 mg/dL (ref 0.2–1.2)

## 2013-12-10 LAB — LIPID PANEL
CHOLESTEROL: 128 mg/dL (ref 0–200)
HDL: 33 mg/dL — ABNORMAL LOW (ref >39)
LDL Cholesterol: 58 mg/dL (ref 0–99)
TRIGLYCERIDES: 185 mg/dL — AB (ref <150)
Total CHOL/HDL Ratio: 3.9 Ratio
VLDL: 37 mg/dL (ref 0–40)

## 2013-12-11 LAB — PSA: PSA: 0.46 ng/mL (ref <=4.00)

## 2013-12-11 LAB — TSH: TSH: 0.909 u[IU]/mL (ref 0.350–4.500)

## 2013-12-14 ENCOUNTER — Ambulatory Visit (INDEPENDENT_AMBULATORY_CARE_PROVIDER_SITE_OTHER): Payer: PRIVATE HEALTH INSURANCE | Admitting: Family Medicine

## 2013-12-14 ENCOUNTER — Encounter: Payer: Self-pay | Admitting: Family Medicine

## 2013-12-14 VITALS — BP 144/94 | Ht 73.0 in | Wt 248.4 lb

## 2013-12-14 DIAGNOSIS — R7989 Other specified abnormal findings of blood chemistry: Secondary | ICD-10-CM

## 2013-12-14 DIAGNOSIS — E786 Lipoprotein deficiency: Secondary | ICD-10-CM | POA: Insufficient documentation

## 2013-12-14 DIAGNOSIS — Z0000 Encounter for general adult medical examination without abnormal findings: Secondary | ICD-10-CM

## 2013-12-14 DIAGNOSIS — I10 Essential (primary) hypertension: Secondary | ICD-10-CM

## 2013-12-14 DIAGNOSIS — E291 Testicular hypofunction: Secondary | ICD-10-CM

## 2013-12-14 MED ORDER — AMLODIPINE BESYLATE 2.5 MG PO TABS: 2.5000 mg | ORAL_TABLET | Freq: Every day | ORAL | Status: DC

## 2013-12-14 MED ORDER — HYOSCYAMINE SULFATE 0.125 MG SL SUBL: 0.1250 mg | SUBLINGUAL_TABLET | SUBLINGUAL | Status: DC | PRN

## 2013-12-14 NOTE — Progress Notes (Signed)
   Subjective:    Patient ID: Mitchell Holden, male    DOB: 1961/02/09, 53 y.o.   MRN: [id number]  HPI   The patient comes in today for a wellness visit. Patient no longer taking testosterone. I told the patient and did not feel that testosterone would be wise.   A review of their health history was completed.  A review of medications was also completed.  Any needed refills; no  Eating habits: eating good  Falls/  MVA accidents in past few months: no  Regular exercise: not since nasal surgery  Specialist pt sees on regular basis: yes- Dr Suszanne Connerseoh  Preventative health issues were discussed.   Additional concerns: no Review of Systems  Constitutional: Negative for fever, activity change and appetite change.  HENT: Negative for congestion and rhinorrhea.   Eyes: Negative for discharge.  Respiratory: Negative for cough and wheezing.   Cardiovascular: Negative for chest pain.  Gastrointestinal: Negative for vomiting, abdominal pain and blood in stool.  Genitourinary: Negative for frequency and difficulty urinating.  Musculoskeletal: Negative for neck pain.  Skin: Negative for rash.  Allergic/Immunologic: Negative for environmental allergies and food allergies.  Neurological: Negative for weakness and headaches.  Psychiatric/Behavioral: Negative for agitation.       Objective:   Physical Exam  Constitutional: He appears well-developed and well-nourished.  HENT:  Head: Normocephalic and atraumatic.  Right Ear: External ear normal.  Left Ear: External ear normal.  Nose: Nose normal.  Mouth/Throat: Oropharynx is clear and moist.  Eyes: EOM are normal. Pupils are equal, round, and reactive to light.  Neck: Normal range of motion. Neck supple. No thyromegaly present.  Cardiovascular: Normal rate, regular rhythm and normal heart sounds.   No murmur heard. Pulmonary/Chest: Effort normal and breath sounds normal. No respiratory distress. He has no wheezes.  Abdominal: Soft.  Bowel sounds are normal. He exhibits no distension and no mass. There is no tenderness.  Genitourinary: Prostate normal.  Musculoskeletal: Normal range of motion. He exhibits no edema.  Lymphadenopathy:    He has no cervical adenopathy.  Neurological: He is alert. He exhibits normal muscle tone.  Skin: Skin is warm and dry. No erythema.  Psychiatric: He has a normal mood and affect. His behavior is normal. Judgment normal.          Assessment & Plan:  Wellness-dietary measures safety measures all discussed  Up-to-date on colonoscopy  Blood pressure did not tolerate lisinopril. Lisinopril has been stopped. Use amlodipine 2.5 mg followup in 4 weeks to recheck blood pressure no later than mid-September.  LDL looks good.  Thyroid good.  Patient no longer takes testosterone. I believe that it is wise choice   Has occasional irritable bowel Levsin SL when necessarypatient

## 2013-12-14 NOTE — Patient Instructions (Signed)
DASH Eating Plan  DASH stands for "Dietary Approaches to Stop Hypertension." The DASH eating plan is a healthy eating plan that has been shown to reduce high blood pressure (hypertension). Additional health benefits may include reducing the risk of type 2 diabetes mellitus, heart disease, and stroke. The DASH eating plan may also help with weight loss.  WHAT DO I NEED TO KNOW ABOUT THE DASH EATING PLAN?  For the DASH eating plan, you will follow these general guidelines:  · Choose foods with a percent daily value for sodium of less than 5% (as listed on the food label).  · Use salt-free seasonings or herbs instead of table salt or sea salt.  · Check with your health care provider or pharmacist before using salt substitutes.  · Eat lower-sodium products, often labeled as "lower sodium" or "no salt added."  · Eat fresh foods.  · Eat more vegetables, fruits, and low-fat dairy products.  · Choose whole grains. Look for the word "whole" as the first word in the ingredient list.  · Choose fish and skinless chicken or turkey more often than red meat. Limit fish, poultry, and meat to 6 oz (170 g) each day.  · Limit sweets, desserts, sugars, and sugary drinks.  · Choose heart-healthy fats.  · Limit cheese to 1 oz (28 g) per day.  · Eat more home-cooked food and less restaurant, buffet, and fast food.  · Limit fried foods.  · Cook foods using methods other than frying.  · Limit canned vegetables. If you do use them, rinse them well to decrease the sodium.  · When eating at a restaurant, ask that your food be prepared with less salt, or no salt if possible.  WHAT FOODS CAN I EAT?  Seek help from a dietitian for individual calorie needs.  Grains  Whole grain or whole wheat bread. Brown rice. Whole grain or whole wheat pasta. Quinoa, bulgur, and whole grain cereals. Low-sodium cereals. Corn or whole wheat flour tortillas. Whole grain cornbread. Whole grain crackers. Low-sodium crackers.  Vegetables  Fresh or frozen vegetables  (raw, steamed, roasted, or grilled). Low-sodium or reduced-sodium tomato and vegetable juices. Low-sodium or reduced-sodium tomato sauce and paste. Low-sodium or reduced-sodium canned vegetables.   Fruits  All fresh, canned (in natural juice), or frozen fruits.  Meat and Other Protein Products  Ground beef (85% or leaner), grass-fed beef, or beef trimmed of fat. Skinless chicken or turkey. Ground chicken or turkey. Pork trimmed of fat. All fish and seafood. Eggs. Dried beans, peas, or lentils. Unsalted nuts and seeds. Unsalted canned beans.  Dairy  Low-fat dairy products, such as skim or 1% milk, 2% or reduced-fat cheeses, low-fat ricotta or cottage cheese, or plain low-fat yogurt. Low-sodium or reduced-sodium cheeses.  Fats and Oils  Tub margarines without trans fats. Light or reduced-fat mayonnaise and salad dressings (reduced sodium). Avocado. Safflower, olive, or canola oils. Natural peanut or almond butter.  Other  Unsalted popcorn and pretzels.  The items listed above may not be a complete list of recommended foods or beverages. Contact your dietitian for more options.  WHAT FOODS ARE NOT RECOMMENDED?  Grains  White bread. White pasta. White rice. Refined cornbread. Bagels and croissants. Crackers that contain trans fat.  Vegetables  Creamed or fried vegetables. Vegetables in a cheese sauce. Regular canned vegetables. Regular canned tomato sauce and paste. Regular tomato and vegetable juices.  Fruits  Dried fruits. Canned fruit in light or heavy syrup. Fruit juice.  Meat and Other Protein   Products  Fatty cuts of meat. Ribs, chicken wings, bacon, sausage, bologna, salami, chitterlings, fatback, hot dogs, bratwurst, and packaged luncheon meats. Salted nuts and seeds. Canned beans with salt.  Dairy  Whole or 2% milk, cream, half-and-half, and cream cheese. Whole-fat or sweetened yogurt. Full-fat cheeses or blue cheese. Nondairy creamers and whipped toppings. Processed cheese, cheese spreads, or cheese  curds.  Condiments  Onion and garlic salt, seasoned salt, table salt, and sea salt. Canned and packaged gravies. Worcestershire sauce. Tartar sauce. Barbecue sauce. Teriyaki sauce. Soy sauce, including reduced sodium. Steak sauce. Fish sauce. Oyster sauce. Cocktail sauce. Horseradish. Ketchup and mustard. Meat flavorings and tenderizers. Bouillon cubes. Hot sauce. Tabasco sauce. Marinades. Taco seasonings. Relishes.  Fats and Oils  Butter, stick margarine, lard, shortening, ghee, and bacon fat. Coconut, palm kernel, or palm oils. Regular salad dressings.  Other  Pickles and olives. Salted popcorn and pretzels.  The items listed above may not be a complete list of foods and beverages to avoid. Contact your dietitian for more information.  WHERE CAN I FIND MORE INFORMATION?  National Heart, Lung, and [employer] Institute: www.nhlbi.nih.gov/health/health-topics/topics/dash/  Document Released: 05/03/2011 Document Revised: 05/19/2013 Document Reviewed: 03/18/2013  Exit[employer]® Patient Information ©2015 [employer]. This information is not intended to replace advice given to you by your health care provider. Make sure you discuss any questions you have with your health care provider.

## 2014-02-12 ENCOUNTER — Ambulatory Visit: Payer: PRIVATE HEALTH INSURANCE | Admitting: Family Medicine

## 2014-06-02 ENCOUNTER — Ambulatory Visit (INDEPENDENT_AMBULATORY_CARE_PROVIDER_SITE_OTHER): Payer: BLUE CROSS/BLUE SHIELD | Admitting: Family Medicine

## 2014-06-02 ENCOUNTER — Encounter: Payer: Self-pay | Admitting: Family Medicine

## 2014-06-02 VITALS — BP 180/110 | Temp 98.4°F | Ht 73.0 in | Wt 253.4 lb

## 2014-06-02 DIAGNOSIS — E781 Pure hyperglyceridemia: Secondary | ICD-10-CM

## 2014-06-02 DIAGNOSIS — M1000 Idiopathic gout, unspecified site: Secondary | ICD-10-CM

## 2014-06-02 MED ORDER — INDOMETHACIN 50 MG PO CAPS: 50.0000 mg | ORAL_CAPSULE | Freq: Three times a day (TID) | ORAL | Status: DC | PRN

## 2014-06-02 NOTE — Progress Notes (Signed)
   Subjective:    Patient ID: Mitchell Holden, male    DOB: [birth year], 54 y.o.   MRN: [id number]  Toe Pain  The incident occurred 2 days ago. The incident occurred at home. There was no injury mechanism. The pain is present in the right toes. The quality of the pain is described as aching. The pain is moderate. The pain has been intermittent since onset. He reports no foreign bodies present. Nothing aggravates the symptoms. He has tried NSAIDs for the symptoms. The treatment provided no relief.   Patient states that he has no other concerns at this time.  PMH benign  Review of Systems Relates toe joint pain discomfort over the past few days denies ankle pain Pain    Objective:   Physical Exam Toe pain discomfort redness soreness. Foot is normal ankle normal       Assessment & Plan:  He will do his lab work in about 4 weeks time a follow-up office visit regarding blood pressure cholesterol.  Possible gout indomethacin 3 times a day when necessary for discomfort low purine diet hold off on culture seen hold off on allopurinol  Developing ingrown toenail showed him proper measures to push the lateral skin back.

## 2014-06-02 NOTE — Patient Instructions (Signed)

## 2014-07-09 ENCOUNTER — Ambulatory Visit: Payer: BLUE CROSS/BLUE SHIELD | Admitting: Family Medicine

## 2014-07-20 ENCOUNTER — Other Ambulatory Visit: Payer: Self-pay | Admitting: *Deleted

## 2014-07-20 ENCOUNTER — Telehealth: Payer: Self-pay | Admitting: Family Medicine

## 2014-07-20 MED ORDER — LEVOTHYROXINE SODIUM 200 MCG PO TABS: 200.0000 ug | ORAL_TABLET | Freq: Every day | ORAL | Status: DC

## 2014-07-20 NOTE — Telephone Encounter (Signed)
Med sent to pharmacy. Wife was notified.  

## 2014-07-20 NOTE — Telephone Encounter (Signed)
Pt is requesting a refill on his thyroid medicine. [employer] [place].

## 2014-07-21 ENCOUNTER — Other Ambulatory Visit: Payer: Self-pay | Admitting: *Deleted

## 2014-09-23 ENCOUNTER — Other Ambulatory Visit: Payer: Self-pay | Admitting: Family Medicine

## 2014-10-22 ENCOUNTER — Other Ambulatory Visit: Payer: Self-pay | Admitting: Family Medicine

## 2014-10-22 NOTE — Telephone Encounter (Signed)
Needs office visit.

## 2014-11-02 ENCOUNTER — Telehealth: Payer: Self-pay | Admitting: Family Medicine

## 2014-11-02 NOTE — Telephone Encounter (Signed)
error 

## 2014-11-22 ENCOUNTER — Other Ambulatory Visit: Payer: Self-pay | Admitting: Family Medicine

## 2014-12-17 ENCOUNTER — Ambulatory Visit (INDEPENDENT_AMBULATORY_CARE_PROVIDER_SITE_OTHER): Payer: BLUE CROSS/BLUE SHIELD | Admitting: Family Medicine

## 2014-12-17 ENCOUNTER — Encounter: Payer: Self-pay | Admitting: Family Medicine

## 2014-12-17 VITALS — BP 146/98 | Ht 72.0 in | Wt 255.0 lb

## 2014-12-17 DIAGNOSIS — Z0000 Encounter for general adult medical examination without abnormal findings: Secondary | ICD-10-CM

## 2014-12-17 DIAGNOSIS — I10 Essential (primary) hypertension: Secondary | ICD-10-CM | POA: Diagnosis not present

## 2014-12-17 DIAGNOSIS — E038 Other specified hypothyroidism: Secondary | ICD-10-CM

## 2014-12-17 DIAGNOSIS — Z13220 Encounter for screening for lipoid disorders: Secondary | ICD-10-CM | POA: Diagnosis not present

## 2014-12-17 DIAGNOSIS — Z79899 Other long term (current) drug therapy: Secondary | ICD-10-CM | POA: Diagnosis not present

## 2014-12-17 DIAGNOSIS — R748 Abnormal levels of other serum enzymes: Secondary | ICD-10-CM

## 2014-12-17 DIAGNOSIS — E039 Hypothyroidism, unspecified: Secondary | ICD-10-CM | POA: Diagnosis not present

## 2014-12-17 DIAGNOSIS — R109 Unspecified abdominal pain: Secondary | ICD-10-CM | POA: Diagnosis not present

## 2014-12-17 DIAGNOSIS — Z125 Encounter for screening for malignant neoplasm of prostate: Secondary | ICD-10-CM

## 2014-12-17 LAB — POCT URINALYSIS DIPSTICK
SPEC GRAV UA: 1.015
pH, UA: 7

## 2014-12-17 MED ORDER — AMLODIPINE BESYLATE 5 MG PO TABS: 5.0000 mg | ORAL_TABLET | Freq: Every day | ORAL | Status: DC

## 2014-12-17 MED ORDER — LEVOTHYROXINE SODIUM 200 MCG PO TABS: 200.0000 ug | ORAL_TABLET | Freq: Every day | ORAL | Status: DC

## 2014-12-17 NOTE — Progress Notes (Signed)
   Subjective:    Patient ID: Mitchell Holden, male    DOB: June 19, 1960, 54 y.o.   MRN: [id number]  HPI The patient comes in today for a wellness visit.    A review of their health history was completed.  A review of medications was also completed.  Any needed refills; on levothyroxine 90 day supply.   Eating habits: not health conscious  Falls/  MVA accidents in past few months: none  Regular exercise: none  Specialist pt sees on regular basis: none  Preventative health issues were discussed.   Additional concerns: pain in right side.  Started about 2 weeks ago.   Pt states his bp readings have been running high.   Lower back trouble past few weeks in the lower right Some belching with it patient describes an ache in his right flank that comes and goes one time he got better with belching another time with passing gas another times it seems to got better with certain movements. He denies any radiation down the legs. Not bad with movement, interested in exercise, patient understands he needs exercise in order try to lose some weight  Thyroid- taking th med, he feels that the medication is helping him. He does take a regular basis.  Drinking too much iced tea, patient admits to taking in too much sugars. He is at risk of developing diabetes  Skin dicoloration skin: Discoloration in the groin region consistent with tinea but not active currently  Patella /tibia elevation, he has elevation and tibia where the patella tendon attaches I believe this is genetic because family members have similar findings  The patient has hypertension currently blood pressure reading is not were needs to be we will need to adjust the dose        Review of Systems    denies fever chills sweats nausea vomiting diarrhea Objective:   Physical Exam HEENT is benign neck no masses multiple skin tags noted no nodules lungs are clear no crackles heart is regular pulses normal abdomen soft obese  prostate exam normal GU normal has some pigmentation consistent with recent 10 he a infection tibia plateau region prominent on both sides but I believe this is genetic extremities no edema blood pressure moderately elevated   Should be noted that the patient came in for a physical but also had multiple other issues that he want to cover in more than preventative health was covered    Assessment & Plan:  1. Routine general medical examination at a health care facility Importance of losing weight exercise discussed. Up-to-date on colonoscopy. Prostate exam was completed today. - Lipid panel  2. HTN (hypertension), benign Blood pressure the importance of getting blood pressure under better control we adjusted up on medication. Await the results of lab work. - Lipid panel - Basic metabolic panel  3. High risk medication use Patient is to continuestandard medication - Hepatic function panel  4. Hypothyroidism, unspecified hypothyroidism type Take thyroid medicine on a regular basis check TSH to monitor her overall control - TSH  5. Screening for prostate cancer Prostate was normal check PSA - PSA  6. Flank pain Patient having some flank pain probable musculoskeletal check urine - POCT urinalysis dipstick  7. Screening for lipid disorders Patient with history hyperlipidemia check lipid profile - Lipid panel  Patient also with some pigmentation changes in the groin region consistent with tinea which is healed left behind some pigmentation

## 2014-12-18 LAB — BASIC METABOLIC PANEL
BUN/Creatinine Ratio: 19 (ref 9–20)
BUN: 16 mg/dL (ref 6–24)
CO2: 26 mmol/L (ref 18–29)
CREATININE: 0.85 mg/dL (ref 0.76–1.27)
Calcium: 9.6 mg/dL (ref 8.7–10.2)
Chloride: 100 mmol/L (ref 97–108)
GFR, EST AFRICAN AMERICAN: 113 mL/min/{1.73_m2} (ref >59)
GFR, EST NON AFRICAN AMERICAN: 98 mL/min/{1.73_m2} (ref >59)
Glucose: 90 mg/dL (ref 65–99)
Potassium: 4 mmol/L (ref 3.5–5.2)
Sodium: 142 mmol/L (ref 134–144)

## 2014-12-18 LAB — HEPATIC FUNCTION PANEL
ALK PHOS: 102 IU/L (ref 39–117)
ALT: 78 IU/L — ABNORMAL HIGH (ref 0–44)
AST: 45 IU/L — AB (ref 0–40)
Albumin: 4.9 g/dL (ref 3.5–5.5)
Bilirubin Total: 0.6 mg/dL (ref 0.0–1.2)
Bilirubin, Direct: 0.16 mg/dL (ref 0.00–0.40)
Total Protein: 7.6 g/dL (ref 6.0–8.5)

## 2014-12-18 LAB — TSH: TSH: 1.28 u[IU]/mL (ref 0.450–4.500)

## 2014-12-18 LAB — LIPID PANEL
CHOL/HDL RATIO: 4.7 ratio (ref 0.0–5.0)
Cholesterol, Total: 146 mg/dL (ref 100–199)
HDL: 31 mg/dL — AB (ref >39)
LDL Calculated: 84 mg/dL (ref 0–99)
Triglycerides: 157 mg/dL — ABNORMAL HIGH (ref 0–149)
VLDL Cholesterol Cal: 31 mg/dL (ref 5–40)

## 2014-12-18 LAB — PSA: Prostate Specific Ag, Serum: 0.4 ng/mL (ref 0.0–4.0)

## 2014-12-21 NOTE — Addendum Note (Signed)
Addended by: Metro Kung on: 12/21/2014 11:34 AM   Modules accepted: Orders

## 2015-04-11 ENCOUNTER — Telehealth: Payer: Self-pay | Admitting: Family Medicine

## 2015-04-11 NOTE — Telephone Encounter (Signed)
Please see form 

## 2015-04-12 NOTE — Telephone Encounter (Signed)
Discussed with pt. Pt transferred to front to schedule ov with dr Lorin Picketscott for bp check up. Forms to be kept at the nurse's station until appt.

## 2015-04-12 NOTE — Telephone Encounter (Signed)
The patient dropped off forms that needed to be filled out for his workplace regarding wellness. Patient had wellness exam earlier this year. Nurse's-when the patient was last here his blood pressure was elevated we adjusted his medicine. Please let patient know it would be in his best interest to recheck blood pressure here at the office so we can put in a more accurate blood pressure. The blood pressure he had done back in July is elevated and I would not recommend putting that on his form. This recheck her blood pressure needs to be completed before 06/06/2015. Please see if patient would be willing to follow-up in December rather then as scheduled in January. If not then I recommend nurse visit for blood pressure check. This form was forwarded to the nurses station. Please do not misplace it. This form is very important for this patient when he comes in.

## 2015-05-11 ENCOUNTER — Encounter: Payer: Self-pay | Admitting: Family Medicine

## 2015-05-11 ENCOUNTER — Ambulatory Visit (INDEPENDENT_AMBULATORY_CARE_PROVIDER_SITE_OTHER): Payer: BLUE CROSS/BLUE SHIELD | Admitting: Family Medicine

## 2015-05-11 VITALS — BP 122/80 | Ht 72.0 in | Wt 259.0 lb

## 2015-05-11 DIAGNOSIS — I10 Essential (primary) hypertension: Secondary | ICD-10-CM | POA: Diagnosis not present

## 2015-05-11 NOTE — Progress Notes (Signed)
   Subjective:    Patient ID: Mitchell Holden, male    DOB: 1960-09-30, 54 y.o.   MRN: [id number]  Hypertension This is a chronic problem. The current episode started more than 1 year ago. The problem has been gradually improving since onset. Pertinent negatives include no chest pain. There are no associated agents to hypertension. There are no known risk factors for coronary artery disease. Treatments tried: amlodipine. The current treatment provides moderate improvement. There are no compliance problems.     safety dietary measures discussed. Patient does good job medications. Denies any troubles per Patient has no concerns at this time.   Review of Systems  Constitutional: Negative for activity change, appetite change and fatigue.  HENT: Negative for congestion.   Respiratory: Negative for cough.   Cardiovascular: Negative for chest pain.  Gastrointestinal: Negative for abdominal pain.  Endocrine: Negative for polydipsia and polyphagia.  Neurological: Negative for weakness.  Psychiatric/Behavioral: Negative for confusion.       Objective:   Physical Exam  Constitutional: He appears well-nourished. No distress.  Cardiovascular: Normal rate, regular rhythm and normal heart sounds.   No murmur heard. Pulmonary/Chest: Effort normal and breath sounds normal. No respiratory distress.  Musculoskeletal: He exhibits no edema.  Lymphadenopathy:    He has no cervical adenopathy.  Neurological: He is alert.  Psychiatric: His behavior is normal.  Vitals reviewed.         Assessment & Plan:   HTN good control continue current measures    patient does not want to repeat liver profile currently he relates that he will call back and get papers for lab work including A1c and liver profile.

## 2015-06-17 ENCOUNTER — Ambulatory Visit: Payer: BLUE CROSS/BLUE SHIELD | Admitting: Family Medicine

## 2015-06-20 ENCOUNTER — Other Ambulatory Visit: Payer: Self-pay | Admitting: Family Medicine

## 2015-08-05 ENCOUNTER — Ambulatory Visit (INDEPENDENT_AMBULATORY_CARE_PROVIDER_SITE_OTHER): Payer: BLUE CROSS/BLUE SHIELD | Admitting: Family Medicine

## 2015-08-05 ENCOUNTER — Encounter: Payer: Self-pay | Admitting: Family Medicine

## 2015-08-05 VITALS — BP 150/92 | Ht 72.0 in | Wt 260.0 lb

## 2015-08-05 DIAGNOSIS — R7401 Elevation of levels of liver transaminase levels: Secondary | ICD-10-CM

## 2015-08-05 DIAGNOSIS — E038 Other specified hypothyroidism: Secondary | ICD-10-CM | POA: Diagnosis not present

## 2015-08-05 DIAGNOSIS — R740 Nonspecific elevation of levels of transaminase and lactic acid dehydrogenase [LDH]: Secondary | ICD-10-CM

## 2015-08-05 DIAGNOSIS — R229 Localized swelling, mass and lump, unspecified: Secondary | ICD-10-CM | POA: Diagnosis not present

## 2015-08-05 DIAGNOSIS — I10 Essential (primary) hypertension: Secondary | ICD-10-CM

## 2015-08-05 DIAGNOSIS — D509 Iron deficiency anemia, unspecified: Secondary | ICD-10-CM | POA: Diagnosis not present

## 2015-08-05 DIAGNOSIS — R5383 Other fatigue: Secondary | ICD-10-CM | POA: Diagnosis not present

## 2015-08-05 MED ORDER — ETODOLAC 400 MG PO TABS: 400.0000 mg | ORAL_TABLET | Freq: Two times a day (BID) | ORAL | Status: DC

## 2015-08-05 NOTE — Progress Notes (Signed)
   Subjective:    Patient ID: Mitchell Holden, male    DOB: 1960-12-13, 55 y.o.   MRN: [id number]  Hypertension This is a chronic problem. The current episode started more than 1 year ago. The problem has been gradually improving since onset. There are no associated agents to hypertension. There are no known risk factors for coronary artery disease. Treatments tried: amlodidpine. The current treatment provides moderate improvement. There are no compliance problems.    Patient states that he is having some right arm pain.  Patient has been cutting a lot of wood. Denies any chest pressure tightness pain shortness of breath.  Patient states blood pressure home is always looking good. Denies any problems with this.  Review of Systems No nausea vomiting diarrhea fever chills or sweats    Objective:   Physical Exam  Lungs clear hearts regular pulse normal extremities no edema skin warm dry some tenderness in the forearm muscles on the right arm. Consistent with overuse Patient has a nodule on the base of his scrotum that is enlarged nontender. Approximately half in centimeter in diameter does have excoriations     Assessment & Plan:  1. HTN (hypertension), benign Blood pressure good control continue current measures - CBC with Differential/Platelet - TSH - Lipid panel - Hepatic function panel - Ferritin  2. Other specified hypothyroidism Hypothyroidism continue medication check TSH - CBC with Differential/Platelet - TSH - Lipid panel - Hepatic function panel - Ferritin  3. Other fatigue Patient relates being fatigued at times would like to have his blood checked further we will look at CBC ferritin - CBC with Differential/Platelet - TSH - Lipid panel - Hepatic function panel - Ferritin  4. Anemia, iron deficiency CBC ferritin - CBC with Differential/Platelet - TSH - Lipid panel - Hepatic function panel - Ferritin  5. Skin nodule Patient has skin nodule on the base  of the scrotum. It is approximately half of a centimeter in diameter firm nontender. I recommend dermatology referral - CBC with Differential/Platelet - TSH - Lipid panel - Hepatic function panel - Ferritin - Ambulatory referral to Dermatology  6. Elevated transaminase level Patient with history of elevated transaminase more than likely fatty liver need to repeat these may need additional testing - Hepatic function panel

## 2015-08-06 LAB — HEPATIC FUNCTION PANEL
ALT: 44 IU/L (ref 0–44)
AST: 34 IU/L (ref 0–40)
Albumin: 4.6 g/dL (ref 3.5–5.5)
Alkaline Phosphatase: 112 IU/L (ref 39–117)
BILIRUBIN TOTAL: 0.7 mg/dL (ref 0.0–1.2)
Bilirubin, Direct: 0.16 mg/dL (ref 0.00–0.40)
Total Protein: 7.5 g/dL (ref 6.0–8.5)

## 2015-08-06 LAB — CBC WITH DIFFERENTIAL/PLATELET
Basophils Absolute: 0 10*3/uL (ref 0.0–0.2)
Basos: 0 %
EOS (ABSOLUTE): 0.2 10*3/uL (ref 0.0–0.4)
Eos: 2 %
Hematocrit: 44.5 % (ref 37.5–51.0)
Hemoglobin: 15.4 g/dL (ref 12.6–17.7)
IMMATURE GRANS (ABS): 0 10*3/uL (ref 0.0–0.1)
IMMATURE GRANULOCYTES: 0 %
Lymphocytes Absolute: 3.4 10*3/uL — ABNORMAL HIGH (ref 0.7–3.1)
Lymphs: 32 %
MCH: 29.4 pg (ref 26.6–33.0)
MCHC: 34.6 g/dL (ref 31.5–35.7)
MCV: 85 fL (ref 79–97)
Monocytes Absolute: 0.4 10*3/uL (ref 0.1–0.9)
Monocytes: 4 %
Neutrophils Absolute: 6.7 10*3/uL (ref 1.4–7.0)
Neutrophils: 62 %
Platelets: 232 10*3/uL (ref 150–379)
RBC: 5.24 x10E6/uL (ref 4.14–5.80)
RDW: 14 % (ref 12.3–15.4)
WBC: 10.7 10*3/uL (ref 3.4–10.8)

## 2015-08-06 LAB — LIPID PANEL
CHOL/HDL RATIO: 4.9 ratio (ref 0.0–5.0)
CHOLESTEROL TOTAL: 141 mg/dL (ref 100–199)
HDL: 29 mg/dL — ABNORMAL LOW (ref >39)
LDL Calculated: 67 mg/dL (ref 0–99)
TRIGLYCERIDES: 225 mg/dL — AB (ref 0–149)
VLDL Cholesterol Cal: 45 mg/dL — ABNORMAL HIGH (ref 5–40)

## 2015-08-06 LAB — FERRITIN: Ferritin: 221 ng/mL (ref 30–400)

## 2015-08-06 LAB — TSH: TSH: 3.71 u[IU]/mL (ref 0.450–4.500)

## 2015-08-09 ENCOUNTER — Telehealth: Payer: Self-pay | Admitting: Family Medicine

## 2015-08-09 MED ORDER — ONDANSETRON 8 MG PO TBDP: 8.0000 mg | ORAL_TABLET | Freq: Three times a day (TID) | ORAL | Status: DC | PRN

## 2015-08-09 NOTE — Telephone Encounter (Signed)
Discussed with patient. Patient states he is having stomach cramps, fever and not feeling well. Advised patient that NSAID would not cause a fever and Dr Lorin PicketScott feels the patient has most likely contracted a stomach virus. Zofran sent electronically to pharmacy. Patient verbalized understanding and will follow up if continued sx or ER if worse

## 2015-08-09 NOTE — Telephone Encounter (Signed)
#  1 I would recommend nurse to talk with patient make sure no worrisome symptoms #2 I would stop anti-inflammatory for now. More likely that he is picked up a virus causing the fever and abdominal symptoms. He can try the anti-inflammatory again next week if it causes intestinal upset we will have to try something else. Take the anti-inflammatory with a snack. Bland diet if he needs something for nausea we can call and Zofran 8 mg ODT one 3 times a day when necessary nausea #15 follow-up if ongoing troubles

## 2015-08-09 NOTE — Telephone Encounter (Signed)
Took new NSAID on Sunday, the Lodine, and states upset stomach  More upset & cramping stomach since last night, also fever of 100, runny nose  Could this be related to new NSAID?  Please advise    [facility]

## 2015-08-27 IMAGING — CR DG CHEST 2V
2 series · 2 of 2 positions shown · non-contrast
Comparison: 08/16/2011

CLINICAL DATA: Septoplasty, history of hypertension

EXAM:
CHEST  2 VIEW

[w chest pa]
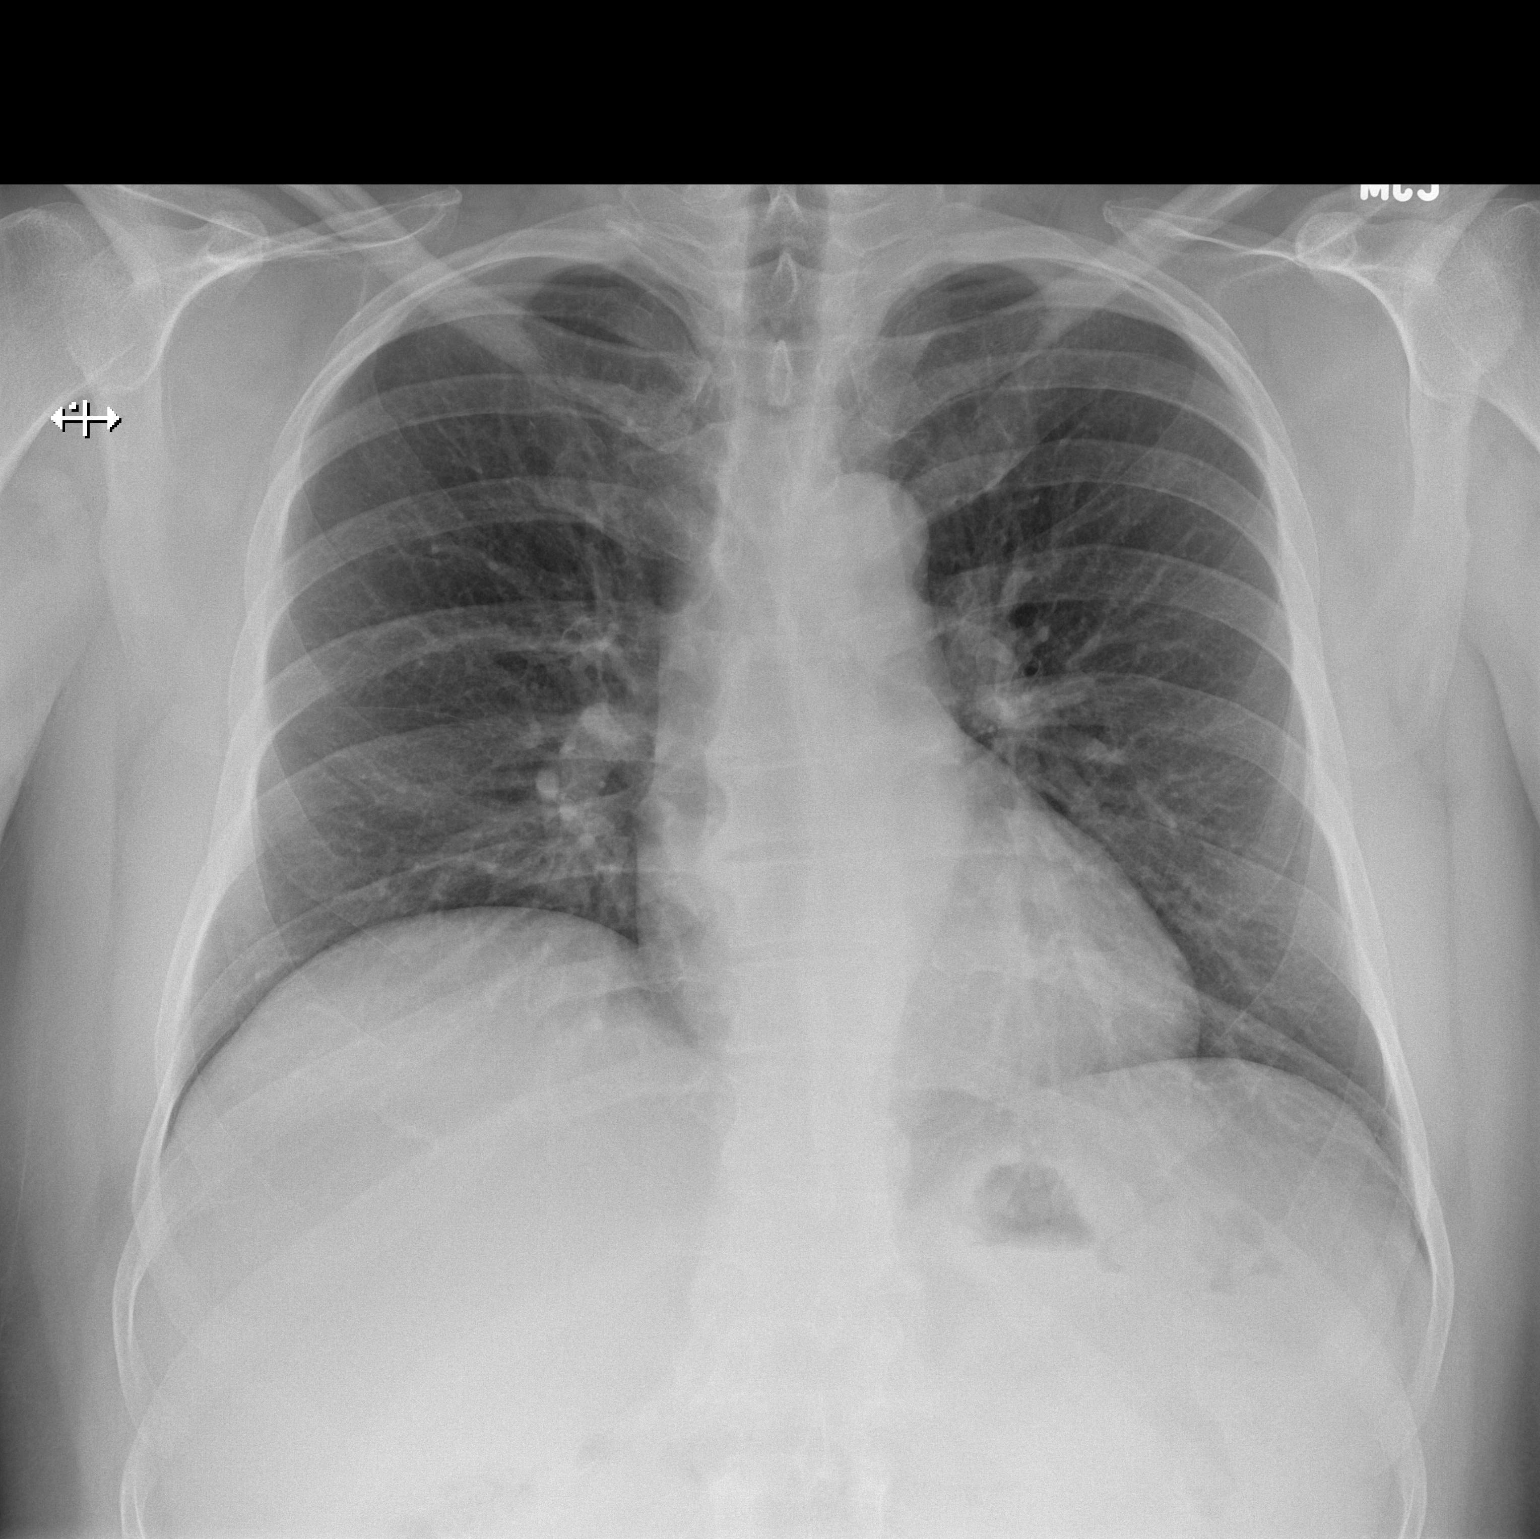

[w chest lat]
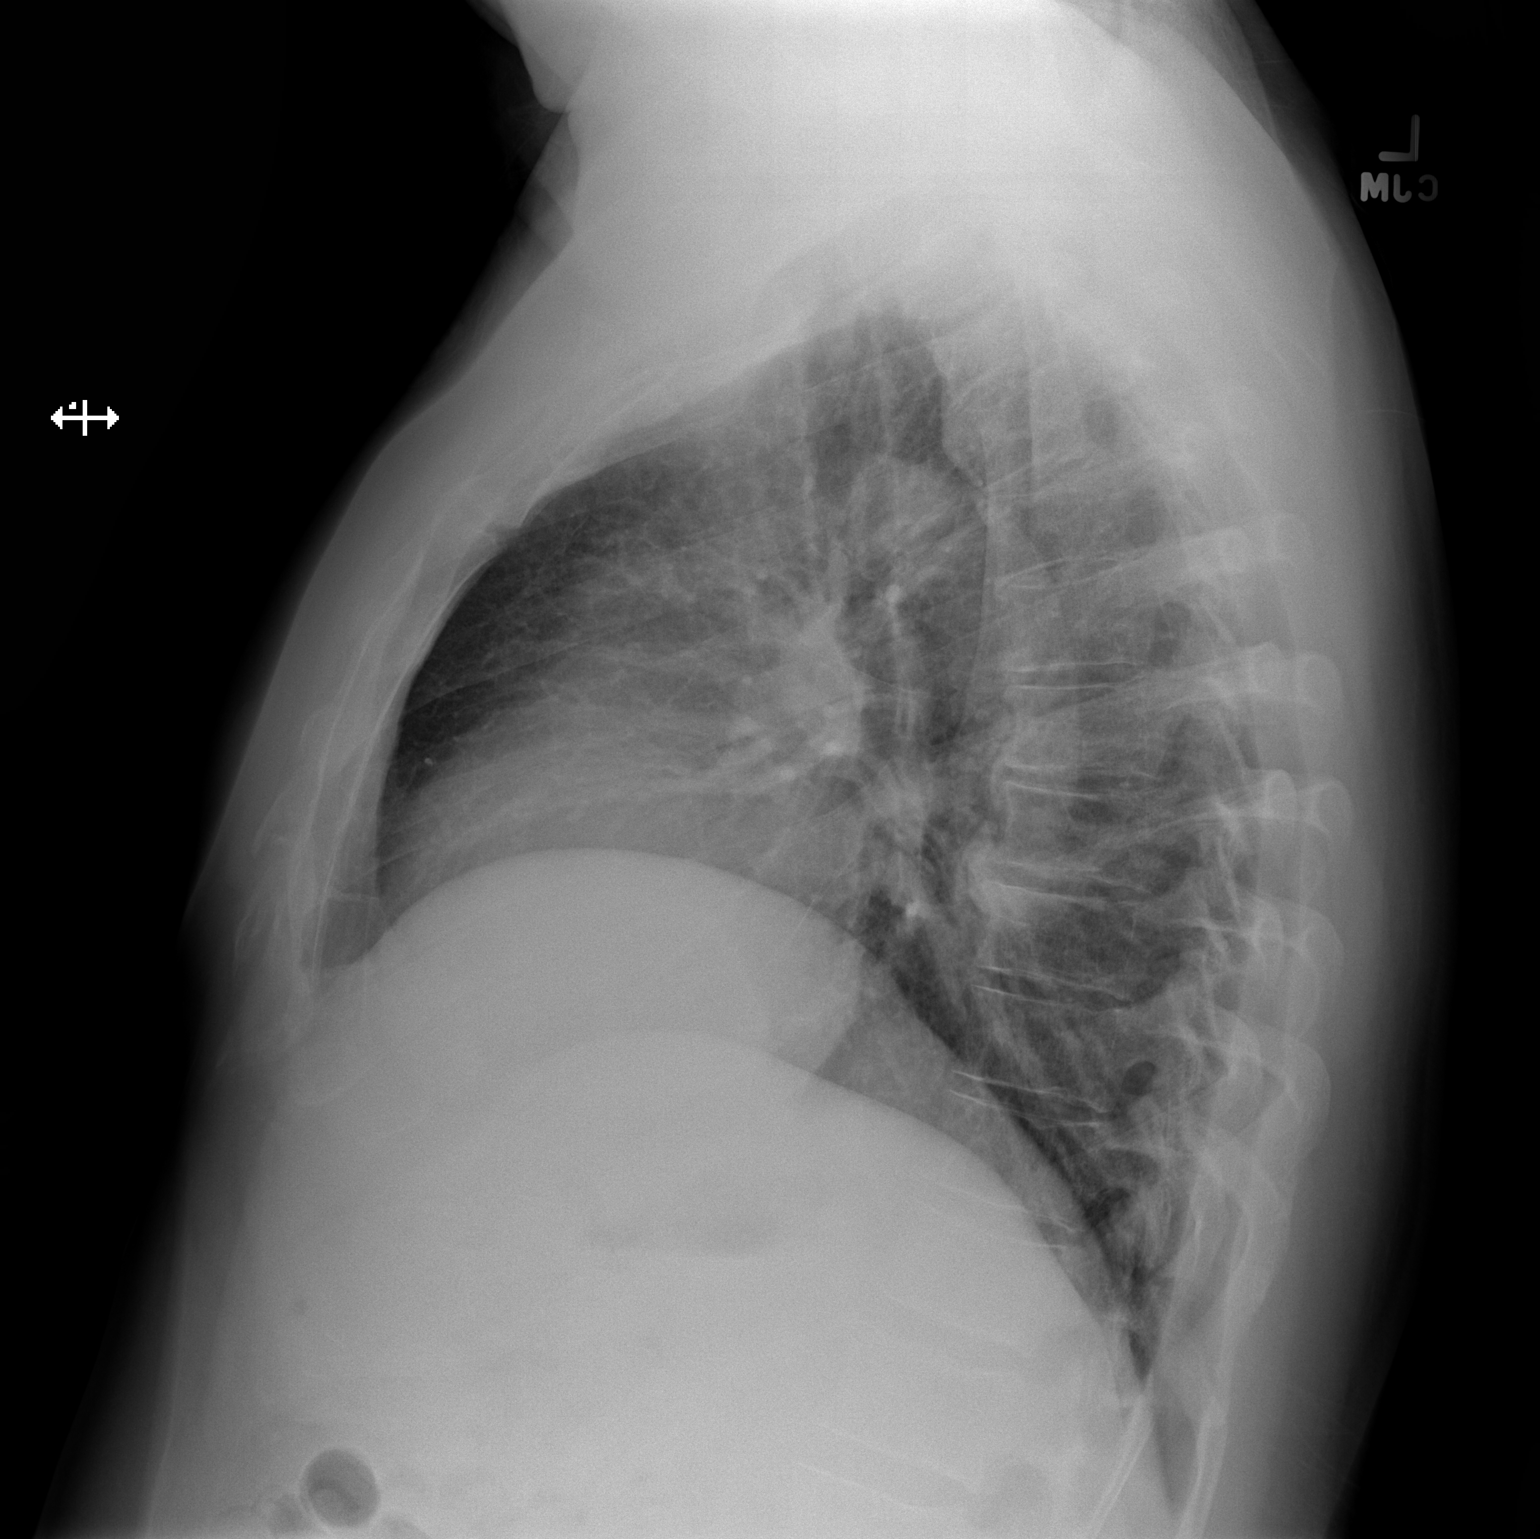

[2 of 2 positions shown; findings below may reference images not displayed]

FINDINGS: Heart size and vascular pattern normal. Mild elevation of the right
diaphragm stable. Lungs clear. No effusions.
IMPRESSION: No active cardiopulmonary disease.

## 2015-12-01 ENCOUNTER — Encounter: Payer: Self-pay | Admitting: Family Medicine

## 2015-12-01 ENCOUNTER — Ambulatory Visit (INDEPENDENT_AMBULATORY_CARE_PROVIDER_SITE_OTHER): Payer: BLUE CROSS/BLUE SHIELD | Admitting: Family Medicine

## 2015-12-01 VITALS — BP 144/90 | Ht 72.0 in | Wt 259.5 lb

## 2015-12-01 DIAGNOSIS — I10 Essential (primary) hypertension: Secondary | ICD-10-CM

## 2015-12-01 DIAGNOSIS — L729 Follicular cyst of the skin and subcutaneous tissue, unspecified: Secondary | ICD-10-CM | POA: Diagnosis not present

## 2015-12-01 MED ORDER — LEVOTHYROXINE SODIUM 200 MCG PO TABS: ORAL_TABLET | ORAL | Status: DC

## 2015-12-01 MED ORDER — AMLODIPINE BESYLATE 5 MG PO TABS: 5.0000 mg | ORAL_TABLET | Freq: Every day | ORAL | Status: DC

## 2015-12-01 NOTE — Progress Notes (Signed)
   Subjective:    Patient ID: Mitchell Holden, male    DOB: 1960/12/17, 55 y.o.   MRN: [id number]  Hypertension This is a chronic problem. The current episode started more than 1 year ago. Pertinent negatives include no chest pain. There are no compliance problems.    Derm tried doxy- had side effect Then tried bactrim for a month and used compresses Had what appears to be a dermal cyst come out   Patient states no other concerns this visit.  Review of Systems  Constitutional: Negative for activity change, appetite change and fatigue.  HENT: Negative for congestion.   Respiratory: Negative for cough.   Cardiovascular: Negative for chest pain.  Gastrointestinal: Negative for abdominal pain.  Endocrine: Negative for polydipsia and polyphagia.  Neurological: Negative for weakness.  Psychiatric/Behavioral: Negative for confusion.       Objective:   Physical Exam  Constitutional: He appears well-nourished. No distress.  Cardiovascular: Normal rate, regular rhythm and normal heart sounds.   No murmur heard. Pulmonary/Chest: Effort normal and breath sounds normal. No respiratory distress.  Musculoskeletal: He exhibits no edema.  Lymphadenopathy:    He has no cervical adenopathy.  Neurological: He is alert.  Psychiatric: His behavior is normal.  Vitals reviewed.         Assessment & Plan:  Mild elevation of blood pressure diet regular physical activity lowering weight is helpful. His blood pressure readings at home looked much better. Continue current measures  Dermal cyst. Took care of itself no sign of resid recheck patient in the fallual aspect

## 2016-02-02 ENCOUNTER — Ambulatory Visit: Payer: BLUE CROSS/BLUE SHIELD | Admitting: Family Medicine

## 2016-04-05 ENCOUNTER — Encounter: Payer: Self-pay | Admitting: Family Medicine

## 2016-04-12 ENCOUNTER — Encounter: Payer: Self-pay | Admitting: Family Medicine

## 2016-04-12 ENCOUNTER — Ambulatory Visit (INDEPENDENT_AMBULATORY_CARE_PROVIDER_SITE_OTHER): Payer: BLUE CROSS/BLUE SHIELD | Admitting: Family Medicine

## 2016-04-12 VITALS — BP 134/86 | Temp 98.4°F | Ht 72.0 in | Wt 259.0 lb

## 2016-04-12 DIAGNOSIS — Z79899 Other long term (current) drug therapy: Secondary | ICD-10-CM | POA: Diagnosis not present

## 2016-04-12 DIAGNOSIS — I10 Essential (primary) hypertension: Secondary | ICD-10-CM

## 2016-04-12 DIAGNOSIS — E784 Other hyperlipidemia: Secondary | ICD-10-CM | POA: Diagnosis not present

## 2016-04-12 DIAGNOSIS — Z125 Encounter for screening for malignant neoplasm of prostate: Secondary | ICD-10-CM

## 2016-04-12 DIAGNOSIS — E038 Other specified hypothyroidism: Secondary | ICD-10-CM | POA: Diagnosis not present

## 2016-04-12 DIAGNOSIS — Z0000 Encounter for general adult medical examination without abnormal findings: Secondary | ICD-10-CM | POA: Diagnosis not present

## 2016-04-12 DIAGNOSIS — E7849 Other hyperlipidemia: Secondary | ICD-10-CM

## 2016-04-12 DIAGNOSIS — R42 Dizziness and giddiness: Secondary | ICD-10-CM | POA: Diagnosis not present

## 2016-04-12 NOTE — Progress Notes (Signed)
   Subjective:    Patient ID: Mitchell Holden, male    DOB: Jan 11, 1961, 55 y.o.   MRN: [id number]  HPI Patient is here today for a follow up visit for dizziness. Patient states that he is feeling much better and symptoms are pretty much resolved. Patient states last week he just felt dizzy and off-balance denied one-sided numbness or weakness denies any nausea vomiting diarrhea or headaches. States he's been under a lot of stress with work and take care of his sick wife. Denies being depressed but states at times he does feel stressed  Patient would like to have some blood work done.  Patient states that he has no other concerns at time time.  Patient states he often eats on the go or eats quickly prepared things. 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 Patient up-to-date on colonoscopy Importance of exercise watching diet losing weight discuss Patient was counseled regarding risk of depression he was counseled regarding symptoms of depression if he starts having depression he will follow-up immediately

## 2016-04-13 LAB — HEPATIC FUNCTION PANEL
ALK PHOS: 126 IU/L — AB (ref 39–117)
ALT: 55 IU/L — AB (ref 0–44)
AST: 34 IU/L (ref 0–40)
Albumin: 5.1 g/dL (ref 3.5–5.5)
BILIRUBIN TOTAL: 0.5 mg/dL (ref 0.0–1.2)
BILIRUBIN, DIRECT: 0.14 mg/dL (ref 0.00–0.40)
TOTAL PROTEIN: 7.9 g/dL (ref 6.0–8.5)

## 2016-04-13 LAB — BASIC METABOLIC PANEL
BUN/Creatinine Ratio: 17 (ref 9–20)
BUN: 14 mg/dL (ref 6–24)
CO2: 24 mmol/L (ref 18–29)
Calcium: 9.4 mg/dL (ref 8.7–10.2)
Chloride: 100 mmol/L (ref 96–106)
Creatinine, Ser: 0.81 mg/dL (ref 0.76–1.27)
GFR calc Af Amer: 114 mL/min/{1.73_m2} (ref >59)
GFR calc non Af Amer: 99 mL/min/{1.73_m2} (ref >59)
GLUCOSE: 90 mg/dL (ref 65–99)
POTASSIUM: 4.1 mmol/L (ref 3.5–5.2)
SODIUM: 144 mmol/L (ref 134–144)

## 2016-04-13 LAB — LIPID PANEL
Chol/HDL Ratio: 4.5 ratio units (ref 0.0–5.0)
Cholesterol, Total: 158 mg/dL (ref 100–199)
HDL: 35 mg/dL — ABNORMAL LOW (ref >39)
LDL CALC: 86 mg/dL (ref 0–99)
Triglycerides: 184 mg/dL — ABNORMAL HIGH (ref 0–149)
VLDL CHOLESTEROL CAL: 37 mg/dL (ref 5–40)

## 2016-04-13 LAB — PSA: PROSTATE SPECIFIC AG, SERUM: 0.7 ng/mL (ref 0.0–4.0)

## 2016-04-13 LAB — TSH: TSH: 2.77 u[IU]/mL (ref 0.450–4.500)

## 2016-04-15 ENCOUNTER — Encounter: Payer: Self-pay | Admitting: Family Medicine

## 2016-04-18 ENCOUNTER — Telehealth: Payer: Self-pay | Admitting: Family Medicine

## 2016-04-18 NOTE — Telephone Encounter (Signed)
Please finish in the form. Please fax the form as requested. Then send a copy of it to Mr. Buchler with no dictation that it was in fact sent in.

## 2016-06-18 ENCOUNTER — Other Ambulatory Visit: Payer: Self-pay | Admitting: Family Medicine

## 2016-07-23 ENCOUNTER — Encounter: Payer: Self-pay | Admitting: Family Medicine

## 2016-07-23 ENCOUNTER — Ambulatory Visit (INDEPENDENT_AMBULATORY_CARE_PROVIDER_SITE_OTHER): Payer: BLUE CROSS/BLUE SHIELD | Admitting: Family Medicine

## 2016-07-23 VITALS — BP 144/90 | Temp 99.6°F | Ht 73.0 in | Wt 256.0 lb

## 2016-07-23 DIAGNOSIS — J019 Acute sinusitis, unspecified: Secondary | ICD-10-CM | POA: Diagnosis not present

## 2016-07-23 DIAGNOSIS — J209 Acute bronchitis, unspecified: Secondary | ICD-10-CM | POA: Diagnosis not present

## 2016-07-23 MED ORDER — DOXYCYCLINE HYCLATE 100 MG PO TABS: 100.0000 mg | ORAL_TABLET | Freq: Two times a day (BID) | ORAL | 0 refills | Status: DC

## 2016-07-23 MED ORDER — HYDROCODONE-HOMATROPINE 5-1.5 MG/5ML PO SYRP: ORAL_SOLUTION | ORAL | 0 refills | Status: DC

## 2016-07-23 NOTE — Progress Notes (Signed)
   Subjective:    Patient ID: Mitchell Holden, male    DOB: [birth year], 58 y.o.   MRN: [id number]  Sinusitis  This is a new problem. Episode onset: one week. Associated symptoms include chills, congestion, coughing and a sore throat. (Fever) Treatments tried: mucinex d, cold and flu meds.   11 patient with head congestion drainage coughing sinus pressure not feeling good low energy some sweats and chills but no high fevers just low-grade temperatures.   Review of Systems  Constitutional: Positive for chills.  HENT: Positive for congestion and sore throat.   Respiratory: Positive for cough.        Objective:   Physical Exam Patient doesn't look like he feels good but does not appear toxic eardrums normal throat is normal neck supple lungs clear       Assessment & Plan:   Viral process Flulike illness Secondary rhinosinusitis Antibiotics prescribed warning signs discuss

## 2016-07-24 ENCOUNTER — Encounter: Payer: Self-pay | Admitting: Family Medicine

## 2016-08-10 ENCOUNTER — Ambulatory Visit: Payer: BLUE CROSS/BLUE SHIELD | Admitting: Family Medicine

## 2016-10-09 ENCOUNTER — Ambulatory Visit: Payer: BLUE CROSS/BLUE SHIELD | Admitting: Family Medicine

## 2016-11-08 ENCOUNTER — Ambulatory Visit (INDEPENDENT_AMBULATORY_CARE_PROVIDER_SITE_OTHER): Payer: BLUE CROSS/BLUE SHIELD | Admitting: Family Medicine

## 2016-11-08 ENCOUNTER — Encounter: Payer: Self-pay | Admitting: Family Medicine

## 2016-11-08 VITALS — BP 142/86 | Ht 73.0 in | Wt 257.0 lb

## 2016-11-08 DIAGNOSIS — E786 Lipoprotein deficiency: Secondary | ICD-10-CM | POA: Diagnosis not present

## 2016-11-08 DIAGNOSIS — Z125 Encounter for screening for malignant neoplasm of prostate: Secondary | ICD-10-CM | POA: Diagnosis not present

## 2016-11-08 DIAGNOSIS — I10 Essential (primary) hypertension: Secondary | ICD-10-CM | POA: Diagnosis not present

## 2016-11-08 DIAGNOSIS — Z79899 Other long term (current) drug therapy: Secondary | ICD-10-CM

## 2016-11-08 DIAGNOSIS — E038 Other specified hypothyroidism: Secondary | ICD-10-CM | POA: Diagnosis not present

## 2016-11-08 MED ORDER — LOSARTAN POTASSIUM 25 MG PO TABS: 25.0000 mg | ORAL_TABLET | Freq: Every day | ORAL | 4 refills | Status: DC

## 2016-11-08 NOTE — Progress Notes (Signed)
   Subjective:    Patient ID: Mitchell Holden, male    DOB: 05-31-1960, 56 y.o.   MRN: [id number]  Hypertension  This is a chronic problem. The current episode started more than 1 year ago. Pertinent negatives include no chest pain, headaches or shortness of breath.  Patient feels that the medications making him feel bad. He states his legs have no strength at times and he feels weak other times he feels okay he would like to change blood pressure medicine Patient states no additional concerns this visit.    Review of Systems  Constitutional: Negative for activity change, fatigue and fever.  Respiratory: Negative for cough and shortness of breath.   Cardiovascular: Negative for chest pain and leg swelling.  Neurological: Negative for headaches.       Objective:   Physical Exam  Constitutional: He appears well-nourished. No distress.  Cardiovascular: Normal rate, regular rhythm and normal heart sounds.   No murmur heard. Pulmonary/Chest: Effort normal and breath sounds normal. No respiratory distress.  Musculoskeletal: He exhibits no edema.  Lymphadenopathy:    He has no cervical adenopathy.  Neurological: He is alert.  Psychiatric: His behavior is normal.  Vitals reviewed.   The patient is due for comprehensive lab work later this year with a wellness checkup      Assessment & Plan:  Patient does not tolerate amlodipine We will go with losartan start off a low-dose Patient to follow-up later this year for comprehensive checkup and wellness check up Patient should send us readings regarding his blood pressure in the course of next few weeks

## 2016-11-25 ENCOUNTER — Encounter: Payer: Self-pay | Admitting: Family Medicine

## 2016-12-10 ENCOUNTER — Encounter: Payer: Self-pay | Admitting: Family Medicine

## 2016-12-10 ENCOUNTER — Telehealth: Payer: Self-pay | Admitting: Family Medicine

## 2016-12-10 NOTE — Telephone Encounter (Signed)
Nurse's-I did review over the patient's my chart messages. He is now using amlodipine 2.5 mg 1 each morning. He is no longer taking losartan. Please discontinue losartan. Please put amlodipine 2.5 mg one per day on his medicine list. Thank you very much.

## 2016-12-11 MED ORDER — AMLODIPINE BESYLATE 2.5 MG PO TABS: 2.5000 mg | ORAL_TABLET | Freq: Every day | ORAL | 0 refills | Status: DC

## 2016-12-11 NOTE — Telephone Encounter (Signed)
Medication updated in EPIC 

## 2016-12-11 NOTE — Addendum Note (Signed)
Addended by: Margaretha SheffieldBROWN, Eliza Green S on: 12/11/2016 08:59 AM   Modules accepted: Orders

## 2016-12-17 ENCOUNTER — Other Ambulatory Visit: Payer: Self-pay | Admitting: Family Medicine

## 2016-12-20 ENCOUNTER — Telehealth: Payer: Self-pay | Admitting: Family Medicine

## 2016-12-20 MED ORDER — HYOSCYAMINE SULFATE 0.125 MG SL SUBL: SUBLINGUAL_TABLET | SUBLINGUAL | 6 refills | Status: AC

## 2016-12-20 NOTE — Telephone Encounter (Signed)
Spoke with patient and informed her per Dr.Scott Luking- we sent in medication to requested pharmacy.

## 2016-12-20 NOTE — Telephone Encounter (Signed)
May have medication requested, #45, one sublingual 3 times a day when necessary, 6 refills

## 2016-12-20 NOTE — Telephone Encounter (Signed)
Requesting refill for hyoscyamine subliminal 0.125 mg.  He said he uses these for stomach cramps that he gets occasionally.  [facility] [place]

## 2017-03-01 ENCOUNTER — Ambulatory Visit (INDEPENDENT_AMBULATORY_CARE_PROVIDER_SITE_OTHER): Payer: BLUE CROSS/BLUE SHIELD | Admitting: Family Medicine

## 2017-03-01 ENCOUNTER — Encounter: Payer: Self-pay | Admitting: Family Medicine

## 2017-03-01 VITALS — Temp 98.2°F | Wt 235.8 lb

## 2017-03-01 DIAGNOSIS — J209 Acute bronchitis, unspecified: Secondary | ICD-10-CM | POA: Diagnosis not present

## 2017-03-01 DIAGNOSIS — J4521 Mild intermittent asthma with (acute) exacerbation: Secondary | ICD-10-CM | POA: Diagnosis not present

## 2017-03-01 DIAGNOSIS — J019 Acute sinusitis, unspecified: Secondary | ICD-10-CM | POA: Diagnosis not present

## 2017-03-01 MED ORDER — AZITHROMYCIN 250 MG PO TABS: ORAL_TABLET | ORAL | 0 refills | Status: DC

## 2017-03-01 MED ORDER — ALBUTEROL SULFATE HFA 108 (90 BASE) MCG/ACT IN AERS: 2.0000 | INHALATION_SPRAY | Freq: Four times a day (QID) | RESPIRATORY_TRACT | 2 refills | Status: AC | PRN

## 2017-03-01 NOTE — Progress Notes (Signed)
The patient relates a lot of head congestion drainage coughing sneezing over the past week it's been progressively worse over the past several days with frontal sinus tenderness as well as chest congestion and coughing also feeling tight at times with some wheezing gets a little short of breath if he moves around but otherwise not bad denies high fever chills sweats denies vomiting diarrhea able to drink okay PMH benign interaction is good eardrums are normal mild frontal sinus tenderness maxillary nontender neck no masses lungs are clear no crackles heart is regular respiratory rate is normal-patient does not appear air hungry Tight cough noted but no rails A/P rhinosinusitis Q bronchitis Reactive airway Albuterol when necessary plus also Zithromax If progressive symptoms or if worse follow-up Warning signs were discussed in detail follow-up if any particular problems

## 2017-04-09 ENCOUNTER — Encounter: Payer: BLUE CROSS/BLUE SHIELD | Admitting: Family Medicine

## 2017-05-07 ENCOUNTER — Encounter: Payer: BLUE CROSS/BLUE SHIELD | Admitting: Family Medicine

## 2017-05-14 ENCOUNTER — Encounter: Payer: BLUE CROSS/BLUE SHIELD | Admitting: Family Medicine

## 2017-05-15 ENCOUNTER — Other Ambulatory Visit: Payer: Self-pay | Admitting: Family Medicine

## 2017-05-23 ENCOUNTER — Telehealth: Payer: Self-pay | Admitting: Family Medicine

## 2017-05-23 MED ORDER — AMLODIPINE BESYLATE 2.5 MG PO TABS: 2.5000 mg | ORAL_TABLET | Freq: Every day | ORAL | 0 refills | Status: DC

## 2017-05-23 NOTE — Telephone Encounter (Signed)
Prescription sent electronically to patient. Patient notified. 

## 2017-05-23 NOTE — Telephone Encounter (Signed)
Pt is needing a refill on amLODipine (NORVASC) 2.5 MG tablet   [facility]

## 2017-05-27 ENCOUNTER — Telehealth: Payer: Self-pay | Admitting: Family Medicine

## 2017-05-27 NOTE — Telephone Encounter (Signed)
Review blood work results from [facility] in results folder. °

## 2017-05-29 NOTE — Telephone Encounter (Signed)
Patient's glucose 85 total cholesterol 140 HDL 35 LDL 76 patient has wellness exam at the end of the month

## 2017-06-20 ENCOUNTER — Other Ambulatory Visit: Payer: Self-pay | Admitting: Family Medicine

## 2017-06-20 NOTE — Telephone Encounter (Signed)
Please change to 5 mg,#30 3 refills

## 2017-06-21 ENCOUNTER — Other Ambulatory Visit: Payer: Self-pay

## 2017-06-21 MED ORDER — AMLODIPINE BESYLATE 5 MG PO TABS: 2.5000 mg | ORAL_TABLET | Freq: Every day | ORAL | 3 refills | Status: DC

## 2017-06-21 MED ORDER — AMLODIPINE BESYLATE 5 MG PO TABS: 5.0000 mg | ORAL_TABLET | Freq: Every day | ORAL | 3 refills | Status: DC

## 2017-06-27 ENCOUNTER — Encounter: Payer: Self-pay | Admitting: Family Medicine

## 2017-06-27 ENCOUNTER — Ambulatory Visit (INDEPENDENT_AMBULATORY_CARE_PROVIDER_SITE_OTHER): Payer: BLUE CROSS/BLUE SHIELD | Admitting: Family Medicine

## 2017-06-27 VITALS — BP 170/108 | Ht 71.5 in | Wt 238.0 lb

## 2017-06-27 DIAGNOSIS — E038 Other specified hypothyroidism: Secondary | ICD-10-CM | POA: Diagnosis not present

## 2017-06-27 DIAGNOSIS — Z114 Encounter for screening for human immunodeficiency virus [HIV]: Secondary | ICD-10-CM

## 2017-06-27 DIAGNOSIS — Z1159 Encounter for screening for other viral diseases: Secondary | ICD-10-CM

## 2017-06-27 DIAGNOSIS — I10 Essential (primary) hypertension: Secondary | ICD-10-CM | POA: Diagnosis not present

## 2017-06-27 DIAGNOSIS — Z0001 Encounter for general adult medical examination with abnormal findings: Secondary | ICD-10-CM | POA: Diagnosis not present

## 2017-06-27 DIAGNOSIS — Z125 Encounter for screening for malignant neoplasm of prostate: Secondary | ICD-10-CM

## 2017-06-27 DIAGNOSIS — Z0000 Encounter for general adult medical examination without abnormal findings: Secondary | ICD-10-CM

## 2017-06-27 NOTE — Progress Notes (Signed)
   Subjective:    Patient ID: Mitchell Holden, male    DOB: 01-10-1961, 57 y.o.   MRN: [id number]  HPI The patient comes in today for a wellness visit.  Patient under a lot of stress but denies being depressed denies high fever chills sweats  A review of their health history was completed.  A review of medications was also completed.  Any needed refills; none  Eating habits: health conscious  Falls/  MVA accidents in past few months: none  Regular exercise: none  Specialist pt sees on regular basis: none  Preventative health issues were discussed.   Additional concerns: bp running high    Review of Systems  Constitutional: Negative for activity change, appetite change and fever.  HENT: Negative for congestion and rhinorrhea.   Eyes: Negative for discharge.  Respiratory: Negative for cough and wheezing.   Cardiovascular: Negative for chest pain.  Gastrointestinal: Negative for abdominal pain, blood in stool and vomiting.  Genitourinary: Negative for difficulty urinating and frequency.  Musculoskeletal: Negative for neck pain.  Skin: Negative for rash.  Allergic/Immunologic: Negative for environmental allergies and food allergies.  Neurological: Negative for weakness and headaches.  Psychiatric/Behavioral: Negative for agitation.       Objective:   Physical Exam  Constitutional: He appears well-developed and well-nourished.  HENT:  Head: Normocephalic and atraumatic.  Right Ear: External ear normal.  Left Ear: External ear normal.  Nose: Nose normal.  Mouth/Throat: Oropharynx is clear and moist.  Eyes: Right eye exhibits no discharge. Left eye exhibits no discharge. No scleral icterus.  Neck: Normal range of motion. Neck supple. No thyromegaly present.  Cardiovascular: Normal rate, regular rhythm and normal heart sounds.  No murmur heard. Pulmonary/Chest: Effort normal and breath sounds normal. No respiratory distress. He has no wheezes.  Abdominal: Soft.  Bowel sounds are normal. He exhibits no distension and no mass. There is no tenderness.  Genitourinary: Penis normal.  Musculoskeletal: Normal range of motion. He exhibits no edema.  Lymphadenopathy:    He has no cervical adenopathy.  Neurological: He is alert. He exhibits normal muscle tone. Coordination normal.  Skin: Skin is warm and dry. No erythema.  Psychiatric: He has a normal mood and affect. His behavior is normal. Judgment normal.    Even after having the patient rest blood pressure was still elevated therefore patient needs to do much better job with diet exercise losing weight continuing medication follow-up in 6 weeks      Assessment & Plan:  Adult wellness-complete.wellness physical was conducted today. Importance of diet and exercise were discussed in detail. In addition to this a discussion regarding safety was also covered. We also reviewed over immunizations and gave recommendations regarding current immunization needed for age. In addition to this additional areas were also touched on including: Preventative health exams needed: Colonoscopy up-to-date patient states that he has a family history of colon cancer I told him he should consider getting a colonoscopy completed sooner he does not want to do it currently  Patient was advised yearly wellness exam Patient will send us blood pressure readings back in a couple weeks time and a follow-up in 6 weeks

## 2017-06-27 NOTE — Patient Instructions (Signed)
DASH Eating Plan DASH stands for "Dietary Approaches to Stop Hypertension." The DASH eating plan is a healthy eating plan that has been shown to reduce high blood pressure (hypertension). It may also reduce your risk for type 2 diabetes, heart disease, and stroke. The DASH eating plan may also help with weight loss. What are tips for following this plan? General guidelines  Avoid eating more than 2,300 mg (milligrams) of salt (sodium) a day. If you have hypertension, you may need to reduce your sodium intake to 1,500 mg a day.  Limit alcohol intake to no more than 1 drink a day for nonpregnant women and 2 drinks a day for men. One drink equals 12 oz of beer, 5 oz of wine, or 1 oz of hard liquor.  Work with your health care provider to maintain a healthy body weight or to lose weight. Ask what an ideal weight is for you.  Get at least 30 minutes of exercise that causes your heart to beat faster (aerobic exercise) most days of the week. Activities may include walking, swimming, or biking.  Work with your health care provider or diet and nutrition specialist (dietitian) to adjust your eating plan to your individual calorie needs. Reading food labels  Check food labels for the amount of sodium per serving. Choose foods with less than 5 percent of the Daily Value of sodium. Generally, foods with less than 300 mg of sodium per serving fit into this eating plan.  To find whole grains, look for the word "whole" as the first word in the ingredient list. Shopping  Buy products labeled as "low-sodium" or "no salt added."  Buy fresh foods. Avoid canned foods and premade or frozen meals. Cooking  Avoid adding salt when cooking. Use salt-free seasonings or herbs instead of table salt or sea salt. Check with your health care provider or pharmacist before using salt substitutes.  Do not fry foods. Cook foods using healthy methods such as baking, boiling, grilling, and broiling instead.  Cook with  heart-healthy oils, such as olive, canola, soybean, or sunflower oil. Meal planning   Eat a balanced diet that includes: ? 5 or more servings of fruits and vegetables each day. At each meal, try to fill half of your plate with fruits and vegetables. ? Up to 6-8 servings of whole grains each day. ? Less than 6 oz of lean meat, poultry, or fish each day. A 3-oz serving of meat is about the same size as a deck of cards. One egg equals 1 oz. ? 2 servings of low-fat dairy each day. ? A serving of nuts, seeds, or beans 5 times each week. ? Heart-healthy fats. Healthy fats called Omega-3 fatty acids are found in foods such as flaxseeds and coldwater fish, like sardines, salmon, and mackerel.  Limit how much you eat of the following: ? Canned or prepackaged foods. ? Food that is high in trans fat, such as fried foods. ? Food that is high in saturated fat, such as fatty meat. ? Sweets, desserts, sugary drinks, and other foods with added sugar. ? Full-fat dairy products.  Do not salt foods before eating.  Try to eat at least 2 vegetarian meals each week.  Eat more home-cooked food and less restaurant, buffet, and fast food.  When eating at a restaurant, ask that your food be prepared with less salt or no salt, if possible. What foods are recommended? The items listed may not be a complete list. Talk with your dietitian about what   dietary choices are best for you. Grains Whole-grain or whole-wheat bread. Whole-grain or whole-wheat pasta. Brown rice. Oatmeal. Quinoa. Bulgur. Whole-grain and low-sodium cereals. Pita bread. Low-fat, low-sodium crackers. Whole-wheat flour tortillas. Vegetables Fresh or frozen vegetables (raw, steamed, roasted, or grilled). Low-sodium or reduced-sodium tomato and vegetable juice. Low-sodium or reduced-sodium tomato sauce and tomato paste. Low-sodium or reduced-sodium canned vegetables. Fruits All fresh, dried, or frozen fruit. Canned fruit in natural juice (without  added sugar). Meat and other protein foods Skinless chicken or turkey. Ground chicken or turkey. Pork with fat trimmed off. Fish and seafood. Egg whites. Dried beans, peas, or lentils. Unsalted nuts, nut butters, and seeds. Unsalted canned beans. Lean cuts of beef with fat trimmed off. Low-sodium, lean deli meat. Dairy Low-fat (1%) or fat-free (skim) milk. Fat-free, low-fat, or reduced-fat cheeses. Nonfat, low-sodium ricotta or cottage cheese. Low-fat or nonfat yogurt. Low-fat, low-sodium cheese. Fats and oils Soft margarine without trans fats. Vegetable oil. Low-fat, reduced-fat, or light mayonnaise and salad dressings (reduced-sodium). Canola, safflower, olive, soybean, and sunflower oils. Avocado. Seasoning and other foods Herbs. Spices. Seasoning mixes without salt. Unsalted popcorn and pretzels. Fat-free sweets. What foods are not recommended? The items listed may not be a complete list. Talk with your dietitian about what dietary choices are best for you. Grains Baked goods made with fat, such as croissants, muffins, or some breads. Dry pasta or rice meal packs. Vegetables Creamed or fried vegetables. Vegetables in a cheese sauce. Regular canned vegetables (not low-sodium or reduced-sodium). Regular canned tomato sauce and paste (not low-sodium or reduced-sodium). Regular tomato and vegetable juice (not low-sodium or reduced-sodium). Pickles. Olives. Fruits Canned fruit in a light or heavy syrup. Fried fruit. Fruit in cream or butter sauce. Meat and other protein foods Fatty cuts of meat. Ribs. Fried meat. Bacon. Sausage. Bologna and other processed lunch meats. Salami. Fatback. Hotdogs. Bratwurst. Salted nuts and seeds. Canned beans with added salt. Canned or smoked fish. Whole eggs or egg yolks. Chicken or turkey with skin. Dairy Whole or 2% milk, cream, and half-and-half. Whole or full-fat cream cheese. Whole-fat or sweetened yogurt. Full-fat cheese. Nondairy creamers. Whipped toppings.  Processed cheese and cheese spreads. Fats and oils Butter. Stick margarine. Lard. Shortening. Ghee. Bacon fat. Tropical oils, such as coconut, palm kernel, or palm oil. Seasoning and other foods Salted popcorn and pretzels. Onion salt, garlic salt, seasoned salt, table salt, and sea salt. Worcestershire sauce. Tartar sauce. Barbecue sauce. Teriyaki sauce. Soy sauce, including reduced-sodium. Steak sauce. Canned and packaged gravies. Fish sauce. Oyster sauce. Cocktail sauce. Horseradish that you find on the shelf. Ketchup. Mustard. Meat flavorings and tenderizers. Bouillon cubes. Hot sauce and Tabasco sauce. Premade or packaged marinades. Premade or packaged taco seasonings. Relishes. Regular salad dressings. Where to find more information:  National Heart, Lung, and Blood Institute: www.nhlbi.nih.gov  American Heart Association: www.heart.org Summary  The DASH eating plan is a healthy eating plan that has been shown to reduce high blood pressure (hypertension). It may also reduce your risk for type 2 diabetes, heart disease, and stroke.  With the DASH eating plan, you should limit salt (sodium) intake to 2,300 mg a day. If you have hypertension, you may need to reduce your sodium intake to 1,500 mg a day.  When on the DASH eating plan, aim to eat more fresh fruits and vegetables, whole grains, lean proteins, low-fat dairy, and heart-healthy fats.  Work with your health care provider or diet and nutrition specialist (dietitian) to adjust your eating plan to your individual   calorie needs. This information is not intended to replace advice given to you by your health care provider. Make sure you discuss any questions you have with your health care provider. Document Released: 05/03/2011 Document Revised: 05/07/2016 Document Reviewed: 05/07/2016 Elsevier Interactive Patient Education  2018 Elsevier Inc.  

## 2017-06-28 LAB — HEPATIC FUNCTION PANEL
ALBUMIN: 4.9 g/dL (ref 3.5–5.5)
ALT: 30 IU/L (ref 0–44)
AST: 24 IU/L (ref 0–40)
Alkaline Phosphatase: 102 IU/L (ref 39–117)
BILIRUBIN TOTAL: 0.5 mg/dL (ref 0.0–1.2)
Bilirubin, Direct: 0.15 mg/dL (ref 0.00–0.40)
TOTAL PROTEIN: 7.5 g/dL (ref 6.0–8.5)

## 2017-06-28 LAB — BASIC METABOLIC PANEL
BUN/Creatinine Ratio: 18 (ref 9–20)
BUN: 15 mg/dL (ref 6–24)
CALCIUM: 9.5 mg/dL (ref 8.7–10.2)
CO2: 27 mmol/L (ref 20–29)
Chloride: 102 mmol/L (ref 96–106)
Creatinine, Ser: 0.82 mg/dL (ref 0.76–1.27)
GFR, EST AFRICAN AMERICAN: 113 mL/min/{1.73_m2} (ref >59)
GFR, EST NON AFRICAN AMERICAN: 97 mL/min/{1.73_m2} (ref >59)
Glucose: 105 mg/dL — ABNORMAL HIGH (ref 65–99)
Potassium: 4.3 mmol/L (ref 3.5–5.2)
Sodium: 143 mmol/L (ref 134–144)

## 2017-06-28 LAB — PSA: Prostate Specific Ag, Serum: 0.6 ng/mL (ref 0.0–4.0)

## 2017-06-28 LAB — TSH: TSH: 2.04 u[IU]/mL (ref 0.450–4.500)

## 2017-06-28 LAB — HEPATITIS C ANTIBODY

## 2017-06-28 LAB — HIV ANTIBODY (ROUTINE TESTING W REFLEX): HIV Screen 4th Generation wRfx: NONREACTIVE

## 2017-07-01 ENCOUNTER — Other Ambulatory Visit: Payer: Self-pay | Admitting: Family Medicine

## 2017-07-02 ENCOUNTER — Telehealth: Payer: Self-pay

## 2017-07-02 ENCOUNTER — Encounter: Payer: Self-pay | Admitting: Family Medicine

## 2017-07-02 NOTE — Telephone Encounter (Signed)
Patient sent this message through MyChart. I was unable to hold classes today due to illness (weakness, muscle aches, stomach ailment, and slight elevated temp ~100). I have experienced weakness since the dose of amlodipine was increased to 5 mg. My recent bp readings were 137/75 and 141/75. When I worked outside this weekend I was only able to do so for a couple of hours before becoming exhausted. I wanted to document these symptoms for work. If these symptoms persists, should I return for a visit before Mar 15? Thanks,    Mitchell LopesJimmy Holden  I called and spoke with the pt and he state he is no longer running a temp, he is gassy and having some loose stools, he is eating and drinking fine. The pt sent this message at 4:36Pm today(and it is now after 5 pm), I advised that he needed to be evaluated by urgent care tonight as the office is closed for the day.He states he probably will not do this,but will call back tomorrow to see if we have availability if no better.

## 2017-07-03 NOTE — Telephone Encounter (Signed)
The fact that he had some fever indicates that there could be a viral component.  I do not feel his blood pressure medicine is causing low-grade fever.  The blood pressure readings he stated are within reason.  Our goal is to see his blood pressure around the 130/70-80 Mark.  Hopefully the viral illness will pass on its own.  I would like for the patient to check his blood pressure twice per day over the next several days then send us an update regarding those blood pressure readings.  Certainly if his blood pressure readings are registering will a lot lower than this we may need to adjust his medications.  When he checks his blood pressure he needs to be sitting for at least 5 minutes before checking the blood pressure using the proper technique with his cuff

## 2017-07-03 NOTE — Telephone Encounter (Signed)
Sounds like a good plan

## 2017-07-03 NOTE — Telephone Encounter (Signed)
Patient called states his stomach bug is better and is no longer runny temp.He states he is having some dizzy spells when he stand or turns too quickly. He will take bp's BID and record them and send them to us.

## 2017-07-03 NOTE — Telephone Encounter (Signed)
I called and left a message to r/c. 

## 2017-07-12 ENCOUNTER — Encounter: Payer: Self-pay | Admitting: Family Medicine

## 2017-07-23 ENCOUNTER — Ambulatory Visit (INDEPENDENT_AMBULATORY_CARE_PROVIDER_SITE_OTHER): Payer: BLUE CROSS/BLUE SHIELD | Admitting: Family Medicine

## 2017-07-23 ENCOUNTER — Encounter: Payer: Self-pay | Admitting: Family Medicine

## 2017-07-23 VITALS — BP 168/94 | Ht 71.5 in | Wt 240.0 lb

## 2017-07-23 DIAGNOSIS — I10 Essential (primary) hypertension: Secondary | ICD-10-CM | POA: Diagnosis not present

## 2017-07-23 DIAGNOSIS — J111 Influenza due to unidentified influenza virus with other respiratory manifestations: Secondary | ICD-10-CM | POA: Diagnosis not present

## 2017-07-23 DIAGNOSIS — R6889 Other general symptoms and signs: Secondary | ICD-10-CM | POA: Diagnosis not present

## 2017-07-23 MED ORDER — OSELTAMIVIR PHOSPHATE 75 MG PO CAPS: 75.0000 mg | ORAL_CAPSULE | Freq: Two times a day (BID) | ORAL | 0 refills | Status: DC

## 2017-07-23 NOTE — Patient Instructions (Signed)

## 2017-07-23 NOTE — Progress Notes (Signed)
   Subjective:    Patient ID: Mitchell Holden, male    DOB: Sep 19, 1960, 57 y.o.   MRN: [id number]  HPI Patient is here today with complaints of cough,chills,body ache,abdominal pains,diarrhea,sinus drainage,dizziness,fell last night against the wall.Taking coricidin cold and flu cough drops.On going since Sunday. Is isJust low energy not feeling well States he is having some body aches headaches congestion some chills just not feeling well denies wheezing difficulty breathing states blood pressure is up but he is taking some cold medicines Review of Systems  Constitutional: Negative for activity change, chills and fever.  HENT: Positive for congestion and rhinorrhea. Negative for ear pain.   Eyes: Negative for discharge.  Respiratory: Positive for cough. Negative for wheezing.   Cardiovascular: Negative for chest pain.  Gastrointestinal: Negative for nausea and vomiting.  Musculoskeletal: Negative for arthralgias.       Objective:   Physical Exam  Constitutional: He appears well-developed.  HENT:  Head: Normocephalic and atraumatic.  Mouth/Throat: Oropharynx is clear and moist. No oropharyngeal exudate.  Eyes: Right eye exhibits no discharge. Left eye exhibits no discharge.  Neck: Normal range of motion.  Cardiovascular: Normal rate, regular rhythm and normal heart sounds.  No murmur heard. Pulmonary/Chest: Effort normal and breath sounds normal. No respiratory distress. He has no wheezes. He has no rales.  Lymphadenopathy:    He has no cervical adenopathy.  Neurological: He exhibits normal muscle tone.  Skin: Skin is warm and dry.  Nursing note and vitals reviewed.         Assessment & Plan:  Influenza-the patient was diagnosed with influenza. Patient/family educated about the flu and warning signs to watch for. If difficulty breathing, severe neck pain and stiffness, cyanosis, disorientation, or progressive worsening then immediately get rechecked at that ER. If  progressive symptoms be certain to be rechecked. Supportive measures such as Tylenol/ibuprofen was discussed. No aspirin use in children. And influenza home care instruction sheet was given. Tamiflu prescribed sent into [employer] excuse for the rest of the week  Patient states he will monitor his blood pressure send us readings periodically patient also states that he will do a follow-up office visit in May  Patient was encouraged to stay away from cold medicines

## 2017-07-26 ENCOUNTER — Encounter: Payer: Self-pay | Admitting: Family Medicine

## 2017-08-09 ENCOUNTER — Ambulatory Visit: Payer: BLUE CROSS/BLUE SHIELD | Admitting: Family Medicine

## 2017-08-26 ENCOUNTER — Encounter: Payer: Self-pay | Admitting: Family Medicine

## 2017-08-26 ENCOUNTER — Ambulatory Visit (INDEPENDENT_AMBULATORY_CARE_PROVIDER_SITE_OTHER): Payer: BLUE CROSS/BLUE SHIELD | Admitting: Family Medicine

## 2017-08-26 VITALS — BP 140/92 | Temp 98.3°F | Ht 71.5 in | Wt 248.0 lb

## 2017-08-26 DIAGNOSIS — B9689 Other specified bacterial agents as the cause of diseases classified elsewhere: Secondary | ICD-10-CM

## 2017-08-26 DIAGNOSIS — J019 Acute sinusitis, unspecified: Secondary | ICD-10-CM

## 2017-08-26 MED ORDER — DOXYCYCLINE HYCLATE 100 MG PO CAPS: 100.0000 mg | ORAL_CAPSULE | Freq: Two times a day (BID) | ORAL | 0 refills | Status: DC

## 2017-08-26 NOTE — Progress Notes (Signed)
   Subjective:    Patient ID: Mitchell Holden, male    DOB: 1961/03/30, 57 y.o.   MRN: [id number]  Sinusitis  This is a new problem. Episode onset: one week. Associated symptoms include congestion, coughing and a sore throat. Pertinent negatives include no chills or ear pain. (Fever) Treatments tried: nasal rinse, mucinex.   Significant head congestion drainage or the past 10 days worse over the past week with now with sinus pressure pain discomfort denies wheezing difficulty breathing denies any shortness of breath no high fevers relates moderate sinus pain unable to work today will not be able to work tomorrow   Review of Systems  Constitutional: Negative for activity change, chills and fever.  HENT: Positive for congestion, rhinorrhea and sore throat. Negative for ear pain.   Eyes: Negative for discharge.  Respiratory: Positive for cough. Negative for wheezing.   Cardiovascular: Negative for chest pain.  Gastrointestinal: Negative for nausea and vomiting.  Musculoskeletal: Negative for arthralgias.       Objective:   Physical Exam  Constitutional: He appears well-developed.  HENT:  Head: Normocephalic and atraumatic.  Mouth/Throat: Oropharynx is clear and moist. No oropharyngeal exudate.  Eyes: Right eye exhibits no discharge. Left eye exhibits no discharge.  Neck: Normal range of motion.  Cardiovascular: Normal rate, regular rhythm and normal heart sounds.  No murmur heard. Pulmonary/Chest: Effort normal and breath sounds normal. No respiratory distress. He has no wheezes. He has no rales.  Lymphadenopathy:    He has no cervical adenopathy.  Neurological: He exhibits normal muscle tone.  Skin: Skin is warm and dry.  Nursing note and vitals reviewed.         Assessment & Plan:  Sinusitis Antibiotics prescribed Follow-up if progressive troubles Patient was seen today for upper respiratory illness. It is felt that the patient is dealing with sinusitis. Antibiotics  were prescribed today. Importance of compliance with medication was discussed. Symptoms should gradually resolve over the course of the next several days. If high fevers, progressive illness, difficulty breathing, worsening condition or failure for symptoms to improve over the next several days then the patient is to follow-up. If any emergent conditions the patient is to follow-up in the emergency department otherwise to follow-up in the office.

## 2017-10-01 ENCOUNTER — Telehealth: Payer: Self-pay | Admitting: Family Medicine

## 2017-10-01 ENCOUNTER — Encounter: Payer: Self-pay | Admitting: Family Medicine

## 2017-10-01 NOTE — Telephone Encounter (Signed)
Nurses-please see my chart message.  Patient may have doxycycline twice daily for 10 days he may use his CPAP machine will need to follow-up if he feels he is getting worse or starts running high fever or having severe headache with chest congestion or shortness of breath

## 2017-10-02 ENCOUNTER — Other Ambulatory Visit: Payer: Self-pay | Admitting: Family Medicine

## 2017-10-02 MED ORDER — DOXYCYCLINE HYCLATE 100 MG PO CAPS: 100.0000 mg | ORAL_CAPSULE | Freq: Two times a day (BID) | ORAL | 0 refills | Status: AC

## 2017-10-02 NOTE — Telephone Encounter (Signed)
If his fever persist or does not see improvement by Friday  Early a.m. Then pt will need to call us early in a.m at office to sched ov, sooner if worse thanks

## 2017-10-02 NOTE — Telephone Encounter (Signed)
Pt notified. Med sent to [facility]. Pt stated that he did take one doxy that he had from a while back last night. Stated his fever got up to 101 and he had a very sore throat. Nurse informed pt that if became worse or short of breath, severe headaches with chest congestion, or running a high fever. Pt verbalized understanding

## 2017-10-02 NOTE — Telephone Encounter (Signed)
Pt informed and verbalized understanding

## 2017-10-04 ENCOUNTER — Telehealth: Payer: Self-pay | Admitting: Family Medicine

## 2017-10-04 NOTE — Telephone Encounter (Signed)
Pt contacted office. Pt prescribed Doxy  on 10/02/17 and was told to call back if not feeling better. Pt stated that fever was gone but he did still have yellow discharge when coughing. Pt wanted to know if he needed to be seen or continue antibiotic. Nurse consulted with Dr. Lorin Picket. Per Dr.Scott, it is ok to continue with Doxy. Informed pt that if he had any trouble breathing or shortness of breath to come in. Pt stated he was not having trouble breathing and is feeling better today. Will call back next week to follow up.

## 2017-10-04 NOTE — Telephone Encounter (Signed)
Noted  

## 2017-10-04 NOTE — Telephone Encounter (Signed)
Phone call given to nurse to triage.

## 2017-10-11 ENCOUNTER — Ambulatory Visit: Payer: BLUE CROSS/BLUE SHIELD | Admitting: Family Medicine

## 2017-10-16 ENCOUNTER — Encounter: Payer: Self-pay | Admitting: Family Medicine

## 2017-10-16 ENCOUNTER — Ambulatory Visit (INDEPENDENT_AMBULATORY_CARE_PROVIDER_SITE_OTHER): Payer: BLUE CROSS/BLUE SHIELD | Admitting: Family Medicine

## 2017-10-16 VITALS — BP 148/98 | Temp 98.7°F | Ht 71.5 in | Wt 251.0 lb

## 2017-10-16 DIAGNOSIS — B9689 Other specified bacterial agents as the cause of diseases classified elsewhere: Secondary | ICD-10-CM | POA: Diagnosis not present

## 2017-10-16 DIAGNOSIS — J019 Acute sinusitis, unspecified: Secondary | ICD-10-CM | POA: Diagnosis not present

## 2017-10-16 DIAGNOSIS — J4521 Mild intermittent asthma with (acute) exacerbation: Secondary | ICD-10-CM

## 2017-10-16 MED ORDER — PREDNISONE 20 MG PO TABS: ORAL_TABLET | ORAL | 0 refills | Status: DC

## 2017-10-16 MED ORDER — SULFAMETHOXAZOLE-TRIMETHOPRIM 800-160 MG PO TABS: 1.0000 | ORAL_TABLET | Freq: Two times a day (BID) | ORAL | 0 refills | Status: DC

## 2017-10-16 NOTE — Progress Notes (Signed)
   Subjective:    Patient ID: Mitchell Holden, male    DOB: 01-06-1961, 57 y.o.   MRN: [id number]  Sinusitis  This is a new problem. Episode onset: 2 weeks. Associated symptoms include congestion, coughing and shortness of breath. Pertinent negatives include no chills or ear pain. (Wheezing) Treatments tried: doxy .   Significant head congestion drainage coughing also some significant bronchial involvement with intermittent wheezing denies vomiting diarrhea.  Denies high fever PMH benign   Review of Systems  Constitutional: Negative for activity change, chills and fever.  HENT: Positive for congestion and rhinorrhea. Negative for ear pain.   Eyes: Negative for discharge.  Respiratory: Positive for cough, shortness of breath and wheezing.   Cardiovascular: Negative for chest pain.  Gastrointestinal: Negative for nausea and vomiting.  Musculoskeletal: Negative for arthralgias.       Objective:   Physical Exam  Constitutional: He appears well-developed.  HENT:  Head: Normocephalic.  Mouth/Throat: Oropharynx is clear and moist. No oropharyngeal exudate.  Neck: Normal range of motion.  Cardiovascular: Normal rate, regular rhythm and normal heart sounds.  No murmur heard. Pulmonary/Chest: Effort normal and breath sounds normal. He has no wheezes.  Lymphadenopathy:    He has no cervical adenopathy.  Neurological: He exhibits normal muscle tone.  Skin: Skin is warm and dry.  Nursing note and vitals reviewed.         Assessment & Plan:  Viral syndrome Secondary rhinosinusitis Antibiotics prescribed warning signs discussed Prednisone taper if progressive troubles with problems Follow-up if any problems

## 2017-10-22 ENCOUNTER — Other Ambulatory Visit: Payer: Self-pay | Admitting: Family Medicine

## 2017-11-20 ENCOUNTER — Other Ambulatory Visit: Payer: Self-pay | Admitting: Family Medicine

## 2017-12-19 ENCOUNTER — Other Ambulatory Visit: Payer: Self-pay | Admitting: Family Medicine

## 2017-12-27 ENCOUNTER — Other Ambulatory Visit: Payer: Self-pay | Admitting: Family Medicine

## 2017-12-30 ENCOUNTER — Encounter: Payer: Self-pay | Admitting: Family Medicine

## 2017-12-30 ENCOUNTER — Ambulatory Visit (INDEPENDENT_AMBULATORY_CARE_PROVIDER_SITE_OTHER): Payer: BLUE CROSS/BLUE SHIELD | Admitting: Family Medicine

## 2017-12-30 VITALS — BP 136/86 | Ht 71.5 in | Wt 252.4 lb

## 2017-12-30 DIAGNOSIS — E038 Other specified hypothyroidism: Secondary | ICD-10-CM | POA: Diagnosis not present

## 2017-12-30 DIAGNOSIS — R5383 Other fatigue: Secondary | ICD-10-CM

## 2017-12-30 DIAGNOSIS — I10 Essential (primary) hypertension: Secondary | ICD-10-CM | POA: Diagnosis not present

## 2017-12-30 NOTE — Progress Notes (Signed)
   Subjective:    Patient ID: Mitchell Holden, male    DOB: Oct 15, 1960, 57 y.o.   MRN: [id number]   Hypertension   This is a chronic problem. The current episode started more than 1 year ago. Pertinent negatives include no chest pain, headaches or shortness of breath. Risk factors for coronary artery disease include male gender. Treatments tried: norvasc.  There are no compliance problems.     Patient is working on diet and is going to be picking up exercise He relates a lot of fatigue and tiredness throughout the day by the end of the day he does not feel like doing muchPatient for blood pressure check up.  The patient does have hypertension.  The patient is on medication.  Patient relates compliance with meds. Todays BP reviewed with the patient. Patient denies issues with medication. Patient relates reasonable diet. Patient tries to minimize salt. Patient aware of BP goals.  He denies any chest tightness pressure pain or shortness of breath with it denies any rectal bleeding or hematuria Patient does relate a lot of fatigue tiredness through the day relates gives out of energy by the end of the evening does not feel up to exercising because of this  Review of Systems  Constitutional: Positive for fatigue. Negative for activity change and fever.  HENT: Negative for congestion and rhinorrhea.   Respiratory: Negative for cough and shortness of breath.   Cardiovascular: Negative for chest pain and leg swelling.  Gastrointestinal: Negative for abdominal pain, diarrhea and nausea.  Genitourinary: Negative for dysuria and hematuria.  Neurological: Negative for weakness and headaches.  Psychiatric/Behavioral: Negative for agitation and behavioral problems.       Objective:   Physical Exam  Constitutional: He appears well-nourished. No distress.  HENT:  Head: Normocephalic and atraumatic.  Eyes: Right eye exhibits no discharge. Left eye exhibits no discharge.  Neck: No tracheal deviation  present.  Cardiovascular: Normal rate, regular rhythm and normal heart sounds.  No murmur heard. Pulmonary/Chest: Effort normal and breath sounds normal. No respiratory distress.  Musculoskeletal: He exhibits no edema.  Lymphadenopathy:    He has no cervical adenopathy.  Neurological: He is alert. Coordination normal.  Skin: Skin is warm and dry.  Psychiatric: He has a normal mood and affect. His behavior is normal.  Vitals reviewed.         Assessment & Plan:  HTN- Patient was seen today as part of a visit regarding hypertension. The importance of healthy diet and regular physical activity was discussed. The importance of compliance with medications discussed.  Ideal goal is to keep blood pressure low elevated levels certainly below 140/90 when possible.  The patient was counseled that keeping blood pressure under control lessen his risk of complications.  The importance of regular follow-ups was discussed with the patient.  Low-salt diet such as DASH recommended.  Regular physical activity was recommended as well.  Patient was advised to keep regular follow-ups. Blood pressure was checked under good control  Mild fatigue check lab work to make sure there is not significant anemia issues Hypothyroidism continue medication check lab work await results keep all regular follow-up visits   /Patient not depressed

## 2017-12-31 LAB — CBC WITH DIFFERENTIAL/PLATELET
BASOS ABS: 0 10*3/uL (ref 0.0–0.2)
Basos: 0 %
EOS (ABSOLUTE): 0.3 10*3/uL (ref 0.0–0.4)
Eos: 4 %
Hematocrit: 43.7 % (ref 37.5–51.0)
Hemoglobin: 14.8 g/dL (ref 13.0–17.7)
Immature Grans (Abs): 0 10*3/uL (ref 0.0–0.1)
Immature Granulocytes: 0 %
LYMPHS ABS: 2.7 10*3/uL (ref 0.7–3.1)
Lymphs: 34 %
MCH: 29.1 pg (ref 26.6–33.0)
MCHC: 33.9 g/dL (ref 31.5–35.7)
MCV: 86 fL (ref 79–97)
Monocytes Absolute: 0.4 10*3/uL (ref 0.1–0.9)
Monocytes: 5 %
NEUTROS ABS: 4.5 10*3/uL (ref 1.4–7.0)
Neutrophils: 57 %
PLATELETS: 227 10*3/uL (ref 150–450)
RBC: 5.09 x10E6/uL (ref 4.14–5.80)
RDW: 14.1 % (ref 12.3–15.4)
WBC: 7.9 10*3/uL (ref 3.4–10.8)

## 2017-12-31 LAB — BASIC METABOLIC PANEL
BUN / CREAT RATIO: 20 (ref 9–20)
BUN: 17 mg/dL (ref 6–24)
CHLORIDE: 104 mmol/L (ref 96–106)
CO2: 24 mmol/L (ref 20–29)
CREATININE: 0.86 mg/dL (ref 0.76–1.27)
Calcium: 9.8 mg/dL (ref 8.7–10.2)
GFR calc Af Amer: 110 mL/min/{1.73_m2} (ref >59)
GFR calc non Af Amer: 96 mL/min/{1.73_m2} (ref >59)
Glucose: 82 mg/dL (ref 65–99)
Potassium: 3.9 mmol/L (ref 3.5–5.2)
SODIUM: 144 mmol/L (ref 134–144)

## 2017-12-31 LAB — T4, FREE: FREE T4: 1.66 ng/dL (ref 0.82–1.77)

## 2017-12-31 LAB — TSH: TSH: 1.74 u[IU]/mL (ref 0.450–4.500)

## 2018-01-03 ENCOUNTER — Encounter: Payer: Self-pay | Admitting: Family Medicine

## 2018-06-17 ENCOUNTER — Encounter: Payer: Self-pay | Admitting: Family Medicine

## 2018-06-17 ENCOUNTER — Other Ambulatory Visit: Payer: Self-pay | Admitting: Family Medicine

## 2018-06-17 DIAGNOSIS — Z0000 Encounter for general adult medical examination without abnormal findings: Secondary | ICD-10-CM

## 2018-06-17 DIAGNOSIS — I10 Essential (primary) hypertension: Secondary | ICD-10-CM

## 2018-06-17 DIAGNOSIS — E038 Other specified hypothyroidism: Secondary | ICD-10-CM

## 2018-06-17 DIAGNOSIS — Z125 Encounter for screening for malignant neoplasm of prostate: Secondary | ICD-10-CM

## 2018-06-17 MED ORDER — LEVOTHYROXINE SODIUM 200 MCG PO TABS: ORAL_TABLET | ORAL | 1 refills | Status: DC

## 2018-06-17 MED ORDER — AMLODIPINE BESYLATE 5 MG PO TABS: ORAL_TABLET | ORAL | 5 refills | Status: DC

## 2018-06-17 NOTE — Telephone Encounter (Signed)
Nurses It would be fine to give 5 refills each on those medicines Patient may be seen in May for wellness plus his other chronic health issues at that time He can send [place] some blood pressure readings in a few weeks I recommend lipid, met 7, TSH, liver, PSA before his wellness in May

## 2018-07-01 ENCOUNTER — Encounter: Payer: BLUE CROSS/BLUE SHIELD | Admitting: Family Medicine

## 2018-10-03 ENCOUNTER — Encounter: Payer: BLUE CROSS/BLUE SHIELD | Admitting: Family Medicine

## 2018-12-23 ENCOUNTER — Other Ambulatory Visit: Payer: Self-pay | Admitting: Family Medicine

## 2018-12-30 ENCOUNTER — Other Ambulatory Visit: Payer: Self-pay

## 2018-12-30 ENCOUNTER — Encounter: Payer: Self-pay | Admitting: Family Medicine

## 2018-12-30 ENCOUNTER — Ambulatory Visit (INDEPENDENT_AMBULATORY_CARE_PROVIDER_SITE_OTHER): Payer: BC Managed Care – PPO | Admitting: Family Medicine

## 2018-12-30 VITALS — BP 164/104 | Temp 97.7°F | Ht 72.5 in | Wt 255.8 lb

## 2018-12-30 DIAGNOSIS — Z0000 Encounter for general adult medical examination without abnormal findings: Secondary | ICD-10-CM | POA: Diagnosis not present

## 2018-12-30 DIAGNOSIS — R7989 Other specified abnormal findings of blood chemistry: Secondary | ICD-10-CM | POA: Diagnosis not present

## 2018-12-30 DIAGNOSIS — E038 Other specified hypothyroidism: Secondary | ICD-10-CM

## 2018-12-30 DIAGNOSIS — R5383 Other fatigue: Secondary | ICD-10-CM | POA: Diagnosis not present

## 2018-12-30 DIAGNOSIS — Z125 Encounter for screening for malignant neoplasm of prostate: Secondary | ICD-10-CM

## 2018-12-30 DIAGNOSIS — I10 Essential (primary) hypertension: Secondary | ICD-10-CM

## 2018-12-30 MED ORDER — AMLODIPINE BESYLATE 5 MG PO TABS: ORAL_TABLET | ORAL | 1 refills | Status: DC

## 2018-12-30 MED ORDER — POTASSIUM CHLORIDE ER 10 MEQ PO TBCR: 10.0000 meq | EXTENDED_RELEASE_TABLET | Freq: Every day | ORAL | 1 refills | Status: DC

## 2018-12-30 MED ORDER — HYDROCHLOROTHIAZIDE 12.5 MG PO CAPS: 12.5000 mg | ORAL_CAPSULE | Freq: Every morning | ORAL | 1 refills | Status: DC

## 2018-12-30 NOTE — Progress Notes (Signed)
Subjective:    Patient ID: Mitchell Holden, male    DOB: 03-30-1961, 58 y.o.   MRN: [id number]  HPI The patient comes in today for a wellness visit.  Patient for blood pressure check up.  The patient does have hypertension.  The patient is on medication.  Patient relates compliance with meds. Todays BP reviewed with the patient. Patient denies issues with medication. Patient relates reasonable diet. Patient tries to minimize salt. Patient aware of BP goals.  He brings in blood pressure readings they are above the goal for which we would like to see  Patient with significant fatigue tiredness using a sleep apnea machine on a regular basis.  It is possible this could be related to other issues including anemia or low testosterone.  Patient states he is interested in going on testosterone if possible.  A review of their health history was completed.  A review of medications was also completed.  Any needed refills; none  Eating habits: not health conscious  Falls/  MVA accidents in past few months: nonr  Regular exercise: none  Specialist pt sees on regular basis: none  Preventative health issues were discussed.   Additional concerns: none Patient has thyroid condition.  Takes thyroid medication on a regular basis.  States that the proper way.  Relates compliance.  States no negative side effects.  States condition seems to be under good control.    Review of Systems  Constitutional: Negative for activity change, appetite change and fever.  HENT: Negative for congestion and rhinorrhea.   Eyes: Negative for discharge.  Respiratory: Negative for cough and wheezing.   Cardiovascular: Negative for chest pain.  Gastrointestinal: Negative for abdominal pain, blood in stool and vomiting.  Genitourinary: Negative for difficulty urinating and frequency.  Musculoskeletal: Negative for neck pain.  Skin: Negative for rash.  Allergic/Immunologic: Negative for environmental allergies  and food allergies.  Neurological: Negative for weakness and headaches.  Psychiatric/Behavioral: Negative for agitation.       Objective:   Physical Exam Constitutional:      Appearance: He is well-developed.  HENT:     Head: Normocephalic and atraumatic.     Right Ear: External ear normal.     Left Ear: External ear normal.     Nose: Nose normal.  Eyes:     Pupils: Pupils are equal, round, and reactive to light.  Neck:     Musculoskeletal: Normal range of motion and neck supple.     Thyroid: No thyromegaly.  Cardiovascular:     Rate and Rhythm: Normal rate and regular rhythm.     Heart sounds: Normal heart sounds. No murmur.  Pulmonary:     Effort: Pulmonary effort is normal. No respiratory distress.     Breath sounds: Normal breath sounds. No wheezing.  Abdominal:     General: Bowel sounds are normal. There is no distension.     Palpations: Abdomen is soft. There is no mass.     Tenderness: There is no abdominal tenderness.  Genitourinary:    Penis: Normal.   Musculoskeletal: Normal range of motion.  Lymphadenopathy:     Cervical: No cervical adenopathy.  Skin:    General: Skin is warm and dry.     Findings: No erythema.  Neurological:     Mental Status: He is alert.     Motor: No abnormal muscle tone.  Psychiatric:        Behavior: Behavior normal.        Judgment: Judgment  normal.     Prostate exam normal      Assessment & Plan:  Adult wellness-complete.wellness physical was conducted today. Importance of diet and exercise were discussed in detail.  In addition to this a discussion regarding safety was also covered. We also reviewed over immunizations and gave recommendations regarding current immunization needed for age.  In addition to this additional areas were also touched on including: Preventative health exams needed:  Colonoscopy 2021  Patient was advised yearly wellness exam  Subpar blood pressure control and low-dose diuretic along with  potassium continue other measures patient to send [place] blood pressure readings within the next couple weeks  Fatigue tiredness lab work ordered patient concerned about low testosterone will check multiple different areas  Follow-up again in 6 months sooner problems  Patient was seen today regarding hypothyroidism.  Importance of healthy diet, regular physical activity was discussed.  Importance of compliance with medication and regular checks regarding this was discussed.

## 2019-01-02 ENCOUNTER — Encounter: Payer: Self-pay | Admitting: Family Medicine

## 2019-01-03 LAB — CBC WITH DIFFERENTIAL/PLATELET
Basophils Absolute: 0 10*3/uL (ref 0.0–0.2)
Basos: 1 %
EOS (ABSOLUTE): 0.3 10*3/uL (ref 0.0–0.4)
Eos: 3 %
Hematocrit: 45.5 % (ref 37.5–51.0)
Hemoglobin: 15.6 g/dL (ref 13.0–17.7)
Immature Grans (Abs): 0 10*3/uL (ref 0.0–0.1)
Immature Granulocytes: 0 %
Lymphocytes Absolute: 2.7 10*3/uL (ref 0.7–3.1)
Lymphs: 36 %
MCH: 29 pg (ref 26.6–33.0)
MCHC: 34.3 g/dL (ref 31.5–35.7)
MCV: 85 fL (ref 79–97)
Monocytes Absolute: 0.4 10*3/uL (ref 0.1–0.9)
Monocytes: 5 %
Neutrophils Absolute: 4.1 10*3/uL (ref 1.4–7.0)
Neutrophils: 55 %
Platelets: 261 10*3/uL (ref 150–450)
RBC: 5.38 x10E6/uL (ref 4.14–5.80)
RDW: 13.7 % (ref 11.6–15.4)
WBC: 7.6 10*3/uL (ref 3.4–10.8)

## 2019-01-03 LAB — LIPID PANEL
Chol/HDL Ratio: 5 ratio (ref 0.0–5.0)
Cholesterol, Total: 166 mg/dL (ref 100–199)
HDL: 33 mg/dL — ABNORMAL LOW (ref >39)
LDL Calculated: 98 mg/dL (ref 0–99)
Triglycerides: 175 mg/dL — ABNORMAL HIGH (ref 0–149)
VLDL Cholesterol Cal: 35 mg/dL (ref 5–40)

## 2019-01-03 LAB — HEPATIC FUNCTION PANEL
ALT: 58 IU/L — ABNORMAL HIGH (ref 0–44)
AST: 46 IU/L — ABNORMAL HIGH (ref 0–40)
Albumin: 4.9 g/dL (ref 3.8–4.9)
Alkaline Phosphatase: 104 IU/L (ref 39–117)
Bilirubin Total: 0.6 mg/dL (ref 0.0–1.2)
Bilirubin, Direct: 0.16 mg/dL (ref 0.00–0.40)
Total Protein: 7.5 g/dL (ref 6.0–8.5)

## 2019-01-03 LAB — BASIC METABOLIC PANEL
BUN/Creatinine Ratio: 16 (ref 9–20)
BUN: 13 mg/dL (ref 6–24)
CO2: 24 mmol/L (ref 20–29)
Calcium: 9.2 mg/dL (ref 8.7–10.2)
Chloride: 103 mmol/L (ref 96–106)
Creatinine, Ser: 0.81 mg/dL (ref 0.76–1.27)
GFR calc Af Amer: 112 mL/min/{1.73_m2} (ref >59)
GFR calc non Af Amer: 97 mL/min/{1.73_m2} (ref >59)
Glucose: 90 mg/dL (ref 65–99)
Potassium: 3.6 mmol/L (ref 3.5–5.2)
Sodium: 142 mmol/L (ref 134–144)

## 2019-01-03 LAB — PSA: Prostate Specific Ag, Serum: 0.5 ng/mL (ref 0.0–4.0)

## 2019-01-03 LAB — TESTOSTERONE,FREE AND TOTAL
Testosterone, Free: 6.8 pg/mL — ABNORMAL LOW (ref 7.2–24.0)
Testosterone: 206 ng/dL — ABNORMAL LOW (ref 264–916)

## 2019-01-03 LAB — TSH: TSH: 4.17 u[IU]/mL (ref 0.450–4.500)

## 2019-01-05 ENCOUNTER — Encounter: Payer: Self-pay | Admitting: Family Medicine

## 2019-01-06 ENCOUNTER — Other Ambulatory Visit: Payer: Self-pay | Admitting: Family Medicine

## 2019-01-06 MED ORDER — LEVOTHYROXINE SODIUM 25 MCG PO TABS: ORAL_TABLET | ORAL | 0 refills | Status: DC

## 2019-04-06 ENCOUNTER — Other Ambulatory Visit: Payer: Self-pay | Admitting: Family Medicine

## 2019-04-13 ENCOUNTER — Other Ambulatory Visit: Payer: Self-pay

## 2019-04-13 ENCOUNTER — Ambulatory Visit (INDEPENDENT_AMBULATORY_CARE_PROVIDER_SITE_OTHER): Payer: BC Managed Care – PPO | Admitting: Family Medicine

## 2019-04-13 DIAGNOSIS — R42 Dizziness and giddiness: Secondary | ICD-10-CM

## 2019-04-13 MED ORDER — MECLIZINE HCL 25 MG PO TABS: 25.0000 mg | ORAL_TABLET | Freq: Three times a day (TID) | ORAL | 0 refills | Status: DC | PRN

## 2019-04-13 NOTE — Progress Notes (Signed)
   Subjective:    Patient ID: Mitchell Holden, male    DOB: Feb 19, 1961, 58 y.o.   MRN: [id number]  HPI Pt states on Friday morning, he began to feel dizzy, nauseous and his ears started ringing. The weekend was better. When pt turns head rapidly, he feels funny. Pt has had  some left ear pain and ringing . Pt did vomit on Friday due to being so dizzy. Pt did not eat anything until Saturday. Pt has been able to eat and keep everything down. Pt did take some naproxen to help with pain and that did help a little bit.  The patient does relate that he feels a little bit dizzy and his ears are ringing he denies any head pressure denies coughing no diarrhea no sore throat no fever chills PMH benign Virtual Visit via Telephone Note  I connected with Mitchell Holden on 04/13/19 at  3:50 PM EST by telephone and verified that I am speaking with the correct person using two identifiers.  Location: Patient: home Provider: office   I discussed the limitations, risks, security and privacy concerns of performing an evaluation and management service by telephone and the availability of in person appointments. I also discussed with the patient that there may be a patient responsible charge related to this service. The patient expressed understanding and agreed to proceed.   History of Present Illness:    Observations/Objective:   Assessment and Plan:   Follow Up Instructions:    I discussed the assessment and treatment plan with the patient. The patient was provided an opportunity to ask questions and all were answered. The patient agreed with the plan and demonstrated an understanding of the instructions.   The patient was advised to call back or seek an in-person evaluation if the symptoms worsen or if the condition fails to improve as anticipated.  I provided 16 minutes of non-face-to-face time during this encounter.   [name], LPN    Review of Systems  Constitutional:  Negative for activity change.  HENT: Negative for congestion and rhinorrhea.   Respiratory: Negative for cough and shortness of breath.   Cardiovascular: Negative for chest pain.  Gastrointestinal: Negative for abdominal pain, diarrhea, nausea and vomiting.  Genitourinary: Negative for dysuria and hematuria.  Neurological: Positive for dizziness. Negative for tremors, weakness, numbness and headaches.  Psychiatric/Behavioral: Negative for behavioral problems and confusion.       Objective:   Physical Exam  Today's visit was via telephone Physical exam was not possible for this visit  He is having some ringing in the ear this could be a sign of Mnire's disease but at the present moment will watch     Assessment & Plan:  Most likely vertigo, inner ear, if progressive symptoms or worse to follow-up Meclizine as needed caution drowsiness if not having a good next several days call us and we will work him in for evaluation follow-up sooner problems

## 2019-04-16 ENCOUNTER — Encounter: Payer: Self-pay | Admitting: Family Medicine

## 2019-04-17 MED ORDER — DOXYCYCLINE HYCLATE 100 MG PO TABS: ORAL_TABLET | ORAL | 0 refills | Status: DC

## 2019-04-17 NOTE — Addendum Note (Signed)
Addended by: [name] on: 04/17/2019 11:22 AM   Modules accepted: Orders

## 2019-04-17 NOTE — Telephone Encounter (Signed)
Nurses I think the patient should go ahead and treat with antibiotic at this point Then he should give [place] an update within 10 days Doxycycline 100 mg 1 twice daily with glass of liquids and a snack twice daily for 7 days

## 2019-05-04 ENCOUNTER — Other Ambulatory Visit: Payer: Self-pay | Admitting: Family Medicine

## 2019-06-01 ENCOUNTER — Ambulatory Visit: Payer: BC Managed Care – PPO | Admitting: Family Medicine

## 2019-07-06 ENCOUNTER — Other Ambulatory Visit: Payer: Self-pay | Admitting: Family Medicine

## 2019-07-27 ENCOUNTER — Other Ambulatory Visit: Payer: Self-pay

## 2019-07-27 ENCOUNTER — Ambulatory Visit (INDEPENDENT_AMBULATORY_CARE_PROVIDER_SITE_OTHER): Payer: BC Managed Care – PPO | Admitting: Family Medicine

## 2019-07-27 DIAGNOSIS — J019 Acute sinusitis, unspecified: Secondary | ICD-10-CM

## 2019-07-27 DIAGNOSIS — R42 Dizziness and giddiness: Secondary | ICD-10-CM | POA: Diagnosis not present

## 2019-07-27 DIAGNOSIS — B9689 Other specified bacterial agents as the cause of diseases classified elsewhere: Secondary | ICD-10-CM

## 2019-07-27 MED ORDER — DOXYCYCLINE HYCLATE 100 MG PO TABS: ORAL_TABLET | ORAL | 0 refills | Status: DC

## 2019-07-27 NOTE — Progress Notes (Signed)
   Subjective:    Patient ID: Mitchell Holden, male    DOB: 03-Mar-1961, 59 y.o.   MRN: [id number]  Sinusitis This is a new problem. Episode onset: Last Thursday. (Yellow discharge with sometimes being clear, some bloody discharge when blowing nose; pressure around eyes, some vertigo) Treatments tried: Mucinex D. The treatment provided moderate relief.   Pt declined testing information, states he will not get tested.  Virtual Visit via Telephone Note  I connected with Mitchell Holden on 07/27/19 at  9:30 AM EST by telephone and verified that I am speaking with the correct person using two identifiers.  Location: Patient: home Provider: office   I discussed the limitations, risks, security and privacy concerns of performing an evaluation and management service by telephone and the availability of in person appointments. I also discussed with the patient that there may be a patient responsible charge related to this service. The patient expressed understanding and agreed to proceed.   History of Present Illness:    Observations/Objective:   Assessment and Plan:   Follow Up Instructions:    I discussed the assessment and treatment plan with the patient. The patient was provided an opportunity to ask questions and all were answered. The patient agreed with the plan and demonstrated an understanding of the instructions.   The patient was advised to call back or seek an in-person evaluation if the symptoms worsen or if the condition fails to improve as anticipated.  I provided 21 minutes of non-face-to-face time during this encounter.  No one else sic   Patient rather adamant he has been around no one else sick.  States this is his usual sinusitis symptoms  Frontal headache congestion sore throat.  Starting to get into a bit of dizziness which often occurs.  Now using over-the-counter meclizine  No congestion chest no cough no fever    Review of Systems See  above    Objective:   Physical Exam   Virtual     Assessment & Plan:  Impression rhinosinusitis.  Discussed.  Antibiotics prescribed.  Use Antivert as needed.  Symptom care discussed.  Patient declines Covid testing at this time.  Warning signs discussed in this regard

## 2019-07-29 ENCOUNTER — Ambulatory Visit: Payer: BC Managed Care – PPO | Admitting: Family Medicine

## 2019-07-31 ENCOUNTER — Other Ambulatory Visit: Payer: Self-pay | Admitting: Family Medicine

## 2019-08-06 ENCOUNTER — Other Ambulatory Visit: Payer: Self-pay

## 2019-08-06 ENCOUNTER — Ambulatory Visit (INDEPENDENT_AMBULATORY_CARE_PROVIDER_SITE_OTHER): Payer: BC Managed Care – PPO | Admitting: Family Medicine

## 2019-08-06 ENCOUNTER — Encounter: Payer: Self-pay | Admitting: Family Medicine

## 2019-08-06 DIAGNOSIS — R21 Rash and other nonspecific skin eruption: Secondary | ICD-10-CM | POA: Diagnosis not present

## 2019-08-06 DIAGNOSIS — T7840XA Allergy, unspecified, initial encounter: Secondary | ICD-10-CM

## 2019-08-06 MED ORDER — PREDNISONE 20 MG PO TABS: ORAL_TABLET | ORAL | 0 refills | Status: DC

## 2019-08-06 MED ORDER — LEVOTHYROXINE SODIUM 200 MCG PO TABS: ORAL_TABLET | ORAL | 1 refills | Status: DC

## 2019-08-06 MED ORDER — AMLODIPINE BESYLATE 5 MG PO TABS: ORAL_TABLET | ORAL | 1 refills | Status: DC

## 2019-08-06 MED ORDER — LEVOTHYROXINE SODIUM 25 MCG PO TABS: 25.0000 ug | ORAL_TABLET | Freq: Every day | ORAL | 1 refills | Status: DC

## 2019-08-06 NOTE — Progress Notes (Signed)
   Subjective:    Patient ID: Mitchell Holden, male    DOB: [birth year], 59 y.o.   MRN: [id number]  HPI Pt was seen on 07/27/19 for sinusitis and prescribed Doxycycline. Pt states he began taking the med on Wednesday 07/28/29. Pt states on the 10th dose, he began to become itchy and a rash developed on arms and legs. Pt states he took Benadryl and that made him fall asleep. Pt has one dose that he did not take. Pt states he did notice that with first dose, he began to have drainage and a sore throat but that has subsided.   Virtual Visit via Telephone Note  I connected with Mitchell Holden on 08/06/19 at  9:30 AM EST by telephone and verified that I am speaking with the correct person using two identifiers.  Location: Patient: home Provider: office   I discussed the limitations, risks, security and privacy concerns of performing an evaluation and management service by telephone and the availability of in person appointments. I also discussed with the patient that there may be a patient responsible charge related to this service. The patient expressed understanding and agreed to proceed.   History of Present Illness:    Observations/Objective:   Assessment and Plan:   Follow Up Instructions:    I discussed the assessment and treatment plan with the patient. The patient was provided an opportunity to ask questions and all were answered. The patient agreed with the plan and demonstrated an understanding of the instructions.   The patient was advised to call back or seek an in-person evaluation if the symptoms worsen or if the condition fails to improve as anticipated.  I provided 15 minutes of non-face-to-face time during this encounter.       Review of Systems    Relates red rash on the arms legs itches intensely.  Denies high fever chills sweats wheezing difficulty breathing Objective:   Physical Exam  Today's visit was via telephone Physical exam was not possible for  this visit  Wellness exam along with chronic health issues by August Patient states he is doing well in regards to other issues    Assessment & Plan:  Allergic reaction Prednisone taper Stay away from doxycycline Should infection issues get worse notify us Should any severe allergy issues occur notify us immediately or go to ER

## 2019-08-17 ENCOUNTER — Encounter: Payer: Self-pay | Admitting: Family Medicine

## 2019-10-02 ENCOUNTER — Other Ambulatory Visit: Payer: Self-pay | Admitting: Family Medicine

## 2020-01-01 ENCOUNTER — Ambulatory Visit (INDEPENDENT_AMBULATORY_CARE_PROVIDER_SITE_OTHER): Payer: BC Managed Care – PPO | Admitting: Family Medicine

## 2020-01-01 ENCOUNTER — Other Ambulatory Visit: Payer: Self-pay

## 2020-01-01 ENCOUNTER — Encounter: Payer: Self-pay | Admitting: Family Medicine

## 2020-01-01 VITALS — BP 144/86 | Temp 97.8°F | Ht 72.5 in | Wt 253.8 lb

## 2020-01-01 DIAGNOSIS — R5383 Other fatigue: Secondary | ICD-10-CM | POA: Diagnosis not present

## 2020-01-01 DIAGNOSIS — I10 Essential (primary) hypertension: Secondary | ICD-10-CM | POA: Diagnosis not present

## 2020-01-01 DIAGNOSIS — Z0000 Encounter for general adult medical examination without abnormal findings: Secondary | ICD-10-CM

## 2020-01-01 DIAGNOSIS — Z79899 Other long term (current) drug therapy: Secondary | ICD-10-CM

## 2020-01-01 DIAGNOSIS — Z125 Encounter for screening for malignant neoplasm of prostate: Secondary | ICD-10-CM

## 2020-01-01 DIAGNOSIS — E038 Other specified hypothyroidism: Secondary | ICD-10-CM

## 2020-01-01 DIAGNOSIS — Z13220 Encounter for screening for lipoid disorders: Secondary | ICD-10-CM

## 2020-01-01 DIAGNOSIS — Z1152 Encounter for screening for COVID-19: Secondary | ICD-10-CM

## 2020-01-01 NOTE — Patient Instructions (Signed)
DASH Eating Plan DASH stands for "Dietary Approaches to Stop Hypertension." The DASH eating plan is a healthy eating plan that has been shown to reduce high blood pressure (hypertension). It may also reduce your risk for type 2 diabetes, heart disease, and stroke. The DASH eating plan may also help with weight loss. What are tips for following this plan?  General guidelines  Avoid eating more than 2,300 mg (milligrams) of salt (sodium) a day. If you have hypertension, you may need to reduce your sodium intake to 1,500 mg a day.  Limit alcohol intake to no more than 1 drink a day for nonpregnant women and 2 drinks a day for men. One drink equals 12 oz of beer, 5 oz of wine, or 1 oz of hard liquor.  Work with your health care provider to maintain a healthy body weight or to lose weight. Ask what an ideal weight is for you.  Get at least 30 minutes of exercise that causes your heart to beat faster (aerobic exercise) most days of the week. Activities may include walking, swimming, or biking.  Work with your health care provider or diet and nutrition specialist (dietitian) to adjust your eating plan to your individual calorie needs. Reading food labels   Check food labels for the amount of sodium per serving. Choose foods with less than 5 percent of the Daily Value of sodium. Generally, foods with less than 300 mg of sodium per serving fit into this eating plan.  To find whole grains, look for the word "whole" as the first word in the ingredient list. Shopping  Buy products labeled as "low-sodium" or "no salt added."  Buy fresh foods. Avoid canned foods and premade or frozen meals. Cooking  Avoid adding salt when cooking. Use salt-free seasonings or herbs instead of table salt or sea salt. Check with your health care provider or pharmacist before using salt substitutes.  Do not fry foods. Cook foods using healthy methods such as baking, boiling, grilling, and broiling instead.  Cook with  heart-healthy oils, such as olive, canola, soybean, or sunflower oil. Meal planning  Eat a balanced diet that includes: ? 5 or more servings of fruits and vegetables each day. At each meal, try to fill half of your plate with fruits and vegetables. ? Up to 6-8 servings of whole grains each day. ? Less than 6 oz of lean meat, poultry, or fish each day. A 3-oz serving of meat is about the same size as a deck of cards. One egg equals 1 oz. ? 2 servings of low-fat dairy each day. ? A serving of nuts, seeds, or beans 5 times each week. ? Heart-healthy fats. Healthy fats called Omega-3 fatty acids are found in foods such as flaxseeds and coldwater fish, like sardines, salmon, and mackerel.  Limit how much you eat of the following: ? Canned or prepackaged foods. ? Food that is high in trans fat, such as fried foods. ? Food that is high in saturated fat, such as fatty meat. ? Sweets, desserts, sugary drinks, and other foods with added sugar. ? Full-fat dairy products.  Do not salt foods before eating.  Try to eat at least 2 vegetarian meals each week.  Eat more home-cooked food and less restaurant, buffet, and fast food.  When eating at a restaurant, ask that your food be prepared with less salt or no salt, if possible. What foods are recommended? The items listed may not be a complete list. Talk with your dietitian about   what dietary choices are best for you. Grains Whole-grain or whole-wheat bread. Whole-grain or whole-wheat pasta. Brown rice. Oatmeal. Quinoa. Bulgur. Whole-grain and low-sodium cereals. Pita bread. Low-fat, low-sodium crackers. Whole-wheat flour tortillas. Vegetables Fresh or frozen vegetables (raw, steamed, roasted, or grilled). Low-sodium or reduced-sodium tomato and vegetable juice. Low-sodium or reduced-sodium tomato sauce and tomato paste. Low-sodium or reduced-sodium canned vegetables. Fruits All fresh, dried, or frozen fruit. Canned fruit in natural juice (without  added sugar). Meat and other protein foods Skinless chicken or turkey. Ground chicken or turkey. Pork with fat trimmed off. Fish and seafood. Egg whites. Dried beans, peas, or lentils. Unsalted nuts, nut butters, and seeds. Unsalted canned beans. Lean cuts of beef with fat trimmed off. Low-sodium, lean deli meat. Dairy Low-fat (1%) or fat-free (skim) milk. Fat-free, low-fat, or reduced-fat cheeses. Nonfat, low-sodium ricotta or cottage cheese. Low-fat or nonfat yogurt. Low-fat, low-sodium cheese. Fats and oils Soft margarine without trans fats. Vegetable oil. Low-fat, reduced-fat, or light mayonnaise and salad dressings (reduced-sodium). Canola, safflower, olive, soybean, and sunflower oils. Avocado. Seasoning and other foods Herbs. Spices. Seasoning mixes without salt. Unsalted popcorn and pretzels. Fat-free sweets. What foods are not recommended? The items listed may not be a complete list. Talk with your dietitian about what dietary choices are best for you. Grains Baked goods made with fat, such as croissants, muffins, or some breads. Dry pasta or rice meal packs. Vegetables Creamed or fried vegetables. Vegetables in a cheese sauce. Regular canned vegetables (not low-sodium or reduced-sodium). Regular canned tomato sauce and paste (not low-sodium or reduced-sodium). Regular tomato and vegetable juice (not low-sodium or reduced-sodium). Pickles. Olives. Fruits Canned fruit in a light or heavy syrup. Fried fruit. Fruit in cream or butter sauce. Meat and other protein foods Fatty cuts of meat. Ribs. Fried meat. Bacon. Sausage. Bologna and other processed lunch meats. Salami. Fatback. Hotdogs. Bratwurst. Salted nuts and seeds. Canned beans with added salt. Canned or smoked fish. Whole eggs or egg yolks. Chicken or turkey with skin. Dairy Whole or 2% milk, cream, and half-and-half. Whole or full-fat cream cheese. Whole-fat or sweetened yogurt. Full-fat cheese. Nondairy creamers. Whipped toppings.  Processed cheese and cheese spreads. Fats and oils Butter. Stick margarine. Lard. Shortening. Ghee. Bacon fat. Tropical oils, such as coconut, palm kernel, or palm oil. Seasoning and other foods Salted popcorn and pretzels. Onion salt, garlic salt, seasoned salt, table salt, and sea salt. Worcestershire sauce. Tartar sauce. Barbecue sauce. Teriyaki sauce. Soy sauce, including reduced-sodium. Steak sauce. Canned and packaged gravies. Fish sauce. Oyster sauce. Cocktail sauce. Horseradish that you find on the shelf. Ketchup. Mustard. Meat flavorings and tenderizers. Bouillon cubes. Hot sauce and Tabasco sauce. Premade or packaged marinades. Premade or packaged taco seasonings. Relishes. Regular salad dressings. Where to find more information:  National Heart, Lung, and Blood Institute: www.nhlbi.nih.gov  American Heart Association: www.heart.org Summary  The DASH eating plan is a healthy eating plan that has been shown to reduce high blood pressure (hypertension). It may also reduce your risk for type 2 diabetes, heart disease, and stroke.  With the DASH eating plan, you should limit salt (sodium) intake to 2,300 mg a day. If you have hypertension, you may need to reduce your sodium intake to 1,500 mg a day.  When on the DASH eating plan, aim to eat more fresh fruits and vegetables, whole grains, lean proteins, low-fat dairy, and heart-healthy fats.  Work with your health care provider or diet and nutrition specialist (dietitian) to adjust your eating plan to your   individual calorie needs. This information is not intended to replace advice given to you by your health care provider. Make sure you discuss any questions you have with your health care provider. Document Revised: 04/26/2017 Document Reviewed: 05/07/2016 Elsevier Patient Education  2020 [employer] Inc.  

## 2020-01-01 NOTE — Progress Notes (Signed)
Subjective:    Patient ID: Mitchell Holden, male    DOB: 1961-04-17, 59 y.o.   MRN: [id number]  HPI  The patient comes in today for a wellness visit.    A review of their health history was completed.  A review of medications was also completed.  Any needed refills; yes  Eating habits: trying to do better  Falls/  MVA accidents in past few months: none  Regular exercise: chores  Specialist pt sees on regular basis: none  Preventative health issues were discussed.   Additional concerns: stopped blood pressure med a week ago due to leg aches and fatigue- has felt so much better since stopping med    Review of Systems  Constitutional: Negative for activity change, appetite change and fever.  HENT: Negative for congestion and rhinorrhea.   Eyes: Negative for discharge.  Respiratory: Negative for cough and wheezing.   Cardiovascular: Negative for chest pain.  Gastrointestinal: Negative for abdominal pain, blood in stool and vomiting.  Genitourinary: Negative for difficulty urinating and frequency.  Musculoskeletal: Negative for neck pain.  Skin: Negative for rash.  Allergic/Immunologic: Negative for environmental allergies and food allergies.  Neurological: Negative for weakness and headaches.  Psychiatric/Behavioral: Negative for agitation.       Objective:   Physical Exam Constitutional:      Appearance: He is well-developed.  HENT:     Head: Normocephalic and atraumatic.     Right Ear: External ear normal.     Left Ear: External ear normal.     Nose: Nose normal.  Eyes:     Pupils: Pupils are equal, round, and reactive to light.  Neck:     Thyroid: No thyromegaly.  Cardiovascular:     Rate and Rhythm: Normal rate and regular rhythm.     Heart sounds: Normal heart sounds. No murmur heard.   Pulmonary:     Effort: Pulmonary effort is normal. No respiratory distress.     Breath sounds: Normal breath sounds. No wheezing.  Abdominal:     General: Bowel  sounds are normal. There is no distension.     Palpations: Abdomen is soft. There is no mass.     Tenderness: There is no abdominal tenderness.  Genitourinary:    Penis: Normal.   Musculoskeletal:        General: Normal range of motion.     Cervical back: Normal range of motion and neck supple.  Lymphadenopathy:     Cervical: No cervical adenopathy.  Skin:    General: Skin is warm and dry.     Findings: No erythema.  Neurological:     Mental Status: He is alert.     Motor: No abnormal muscle tone.  Psychiatric:        Behavior: Behavior normal.        Judgment: Judgment normal.     Patient defers about Covid vaccine Very nice patient has strong opinions that he does not want to eat after utilizing the vaccine plus also we did talk about ways to minimize risk of getting sick      Assessment & Plan:  Adult wellness-complete.wellness physical was conducted today. Importance of diet and exercise were discussed in detail.  In addition to this a discussion regarding safety was also covered. We also reviewed over immunizations and gave recommendations regarding current immunization needed for age.  In addition to this additional areas were also touched on including: Preventative health exams needed:  Colonoscopy patient defers currently because of  Covid  Patient was advised yearly wellness exam Patient states he will work hard on his diet work hard on activity we will do a follow-up on blood pressure does not want to start medications currently last medicine made him feel back, patient to follow-up within the next 1 to 2 months Labs were ordered. Covid test taken for additional work purposes patient not sick currently

## 2020-01-02 LAB — BASIC METABOLIC PANEL
BUN/Creatinine Ratio: 14 (ref 10–24)
BUN: 12 mg/dL (ref 8–27)
CO2: 24 mmol/L (ref 20–29)
Calcium: 9.8 mg/dL (ref 8.6–10.2)
Chloride: 104 mmol/L (ref 96–106)
Creatinine, Ser: 0.85 mg/dL (ref 0.76–1.27)
GFR calc Af Amer: 109 mL/min/{1.73_m2} (ref >59)
GFR calc non Af Amer: 95 mL/min/{1.73_m2} (ref >59)
Glucose: 85 mg/dL (ref 65–99)
Potassium: 3.6 mmol/L (ref 3.5–5.2)
Sodium: 146 mmol/L — ABNORMAL HIGH (ref 134–144)

## 2020-01-02 LAB — CBC WITH DIFFERENTIAL/PLATELET
Basophils Absolute: 0.1 10*3/uL (ref 0.0–0.2)
Basos: 1 %
EOS (ABSOLUTE): 0.3 10*3/uL (ref 0.0–0.4)
Eos: 3 %
Hematocrit: 45.1 % (ref 37.5–51.0)
Hemoglobin: 16 g/dL (ref 13.0–17.7)
Immature Grans (Abs): 0 10*3/uL (ref 0.0–0.1)
Immature Granulocytes: 0 %
Lymphocytes Absolute: 3.1 10*3/uL (ref 0.7–3.1)
Lymphs: 33 %
MCH: 29.4 pg (ref 26.6–33.0)
MCHC: 35.5 g/dL (ref 31.5–35.7)
MCV: 83 fL (ref 79–97)
Monocytes Absolute: 0.5 10*3/uL (ref 0.1–0.9)
Monocytes: 5 %
Neutrophils Absolute: 5.5 10*3/uL (ref 1.4–7.0)
Neutrophils: 58 %
Platelets: 273 10*3/uL (ref 150–450)
RBC: 5.44 x10E6/uL (ref 4.14–5.80)
RDW: 12.5 % (ref 11.6–15.4)
WBC: 9.4 10*3/uL (ref 3.4–10.8)

## 2020-01-02 LAB — HEPATIC FUNCTION PANEL
ALT: 59 IU/L — ABNORMAL HIGH (ref 0–44)
AST: 36 IU/L (ref 0–40)
Albumin: 5.2 g/dL — ABNORMAL HIGH (ref 3.8–4.9)
Alkaline Phosphatase: 104 IU/L (ref 48–121)
Bilirubin Total: 0.6 mg/dL (ref 0.0–1.2)
Bilirubin, Direct: 0.18 mg/dL (ref 0.00–0.40)
Total Protein: 7.3 g/dL (ref 6.0–8.5)

## 2020-01-02 LAB — TSH: TSH: 0.291 u[IU]/mL — ABNORMAL LOW (ref 0.450–4.500)

## 2020-01-02 LAB — LIPID PANEL
Chol/HDL Ratio: 4.4 ratio (ref 0.0–5.0)
Cholesterol, Total: 132 mg/dL (ref 100–199)
HDL: 30 mg/dL — ABNORMAL LOW (ref >39)
LDL Chol Calc (NIH): 75 mg/dL (ref 0–99)
Triglycerides: 154 mg/dL — ABNORMAL HIGH (ref 0–149)
VLDL Cholesterol Cal: 27 mg/dL (ref 5–40)

## 2020-01-02 LAB — PSA: Prostate Specific Ag, Serum: 0.5 ng/mL (ref 0.0–4.0)

## 2020-01-05 ENCOUNTER — Encounter: Payer: Self-pay | Admitting: Family Medicine

## 2020-01-05 DIAGNOSIS — R7401 Elevation of levels of liver transaminase levels: Secondary | ICD-10-CM

## 2020-01-05 DIAGNOSIS — R748 Abnormal levels of other serum enzymes: Secondary | ICD-10-CM

## 2020-01-05 LAB — SPECIMEN STATUS REPORT

## 2020-01-05 LAB — NOVEL CORONAVIRUS, NAA

## 2020-01-06 NOTE — Telephone Encounter (Signed)
Nurses Please connect with patient regarding his message to [place]  #1 in regards to blood pressure I would recommend several blood pressure readings over the course of the next 5 to 7 days.  By Tuesday or Wednesday send [place] blood pressure readings.  More than likely will need to initiate new blood pressure medicine.  The goals of blood pressure is to see the top number around 130/bottom numbers are around 80 or less  #2 as for elevated liver enzymes more than likely this is fatty liver but he has had intermittent elevated liver enzymes several different times over the past couple years.  It is best to do some further testing to look at this. I recommend the following Liver profile, hepatitis C antibody, hepatitis B surface antigen, ceruloplasmin, ferritin, TIBC, Also ultrasound of the liver Diagnosis-elevated transaminase  #3 as for the elevated albumin I would recommend when he goes to get his blood drawn he does a good job of being well-hydrated to see how the albumin is.  If it continues to stay elevated we will work that up further

## 2020-01-06 NOTE — Addendum Note (Signed)
Addended by: Marlowe Shores on: 01/06/2020 08:44 AM   Modules accepted: Orders

## 2020-01-11 ENCOUNTER — Other Ambulatory Visit: Payer: Self-pay | Admitting: Family Medicine

## 2020-01-13 ENCOUNTER — Ambulatory Visit (HOSPITAL_COMMUNITY): Payer: PRIVATE HEALTH INSURANCE

## 2020-01-14 ENCOUNTER — Other Ambulatory Visit: Payer: Self-pay | Admitting: *Deleted

## 2020-01-14 ENCOUNTER — Encounter: Payer: Self-pay | Admitting: Family Medicine

## 2020-01-14 DIAGNOSIS — E039 Hypothyroidism, unspecified: Secondary | ICD-10-CM

## 2020-01-14 MED ORDER — LEVOTHYROXINE SODIUM 200 MCG PO TABS: ORAL_TABLET | ORAL | 1 refills | Status: AC

## 2020-01-18 NOTE — Telephone Encounter (Signed)
lvm to schedule appt.  

## 2020-01-18 NOTE — Telephone Encounter (Signed)
Nurses Please set up a nurse visit for the patient to do a nasal swab on Thursday.  This can be collected by the nurse. (Previous swab we did as a office visit did not process therefore needs repeating) This may be done as a drive up to the back door

## 2020-01-21 ENCOUNTER — Other Ambulatory Visit: Payer: BC Managed Care – PPO | Admitting: *Deleted

## 2020-01-21 ENCOUNTER — Other Ambulatory Visit: Payer: Self-pay

## 2020-01-21 DIAGNOSIS — Z1152 Encounter for screening for COVID-19: Secondary | ICD-10-CM

## 2020-01-24 LAB — NOVEL CORONAVIRUS, NAA

## 2020-01-24 LAB — SPECIMEN STATUS REPORT

## 2020-01-25 ENCOUNTER — Encounter: Payer: Self-pay | Admitting: Family Medicine

## 2020-02-05 ENCOUNTER — Ambulatory Visit (INDEPENDENT_AMBULATORY_CARE_PROVIDER_SITE_OTHER): Payer: BC Managed Care – PPO | Admitting: Family Medicine

## 2020-02-05 ENCOUNTER — Other Ambulatory Visit: Payer: Self-pay

## 2020-02-05 DIAGNOSIS — R05 Cough: Secondary | ICD-10-CM | POA: Diagnosis not present

## 2020-02-05 DIAGNOSIS — J019 Acute sinusitis, unspecified: Secondary | ICD-10-CM

## 2020-02-05 DIAGNOSIS — R059 Cough, unspecified: Secondary | ICD-10-CM

## 2020-02-05 MED ORDER — AZITHROMYCIN 250 MG PO TABS: ORAL_TABLET | ORAL | 0 refills | Status: DC

## 2020-02-05 NOTE — Progress Notes (Signed)
   Subjective:    Patient ID: Mitchell Holden, male    DOB: February 27, 1961, 59 y.o.   MRN: [id number]  Sinusitis This is a new problem. Episode onset: Tuesday  The maximum temperature recorded prior to his arrival was 100.4 - 100.9 F. Associated symptoms include congestion and coughing. (Wheezing ) Past treatments include acetaminophen (mucinex).  I am concerned about this patient.  He is having a lot of headache coughing sinus pressure pain and chest congestion.  It is concerning for the possibility of Covid but it could also be other viral illnesses he is also having signs and symptoms of sinus infection as well denies any respiratory distress    Review of Systems  HENT: Positive for congestion.   Respiratory: Positive for cough.        Objective:   Physical Exam O2 saturation 95% HEENT benign Covid sample taken lungs clear with bronchial coughing       Assessment & Plan:  Viral process Possible acute rhinosinusitis Z-Pak as directed If progressive troubles or worse to follow-up Covid test pending Patient is a stay home from work for the next several days.

## 2020-02-08 ENCOUNTER — Encounter: Payer: Self-pay | Admitting: Family Medicine

## 2020-02-08 NOTE — Telephone Encounter (Signed)
So he does not necessarily need to be on an antibiotic currently.  If he would like to give this a few days go its own way that is acceptable.  More than likely the underlying source was a virus.  As for the diarrhea he may use Imodium.  If patient feels he does need to be on an antibiotic potentially Bactrim which is a sulfa medicine could be utilized but patient has unfortunately many different medicines he cannot take due to allergies or side effects.  This limits how many choices we have-no follow-up of the patient though

## 2020-02-08 NOTE — Telephone Encounter (Signed)
No sob, no wheezing. States that stopped. Has not checked but does not feel like he has any fevers. Still having difficulty eating solid food due to diarrhea. Diarrhea is better. Coughing up some phelm every once and a while. Was clear but now light yellow.

## 2020-02-08 NOTE — Telephone Encounter (Signed)
Nurses At this point I would like to know how the patient is doing from a respiratory status.  Fevers?  Difficulty breathing?  Wheezing?  Coughing up anything?  Should be noted that the Covid test is still pending Thanks-Dr. Lorin Picket

## 2020-02-09 LAB — NOVEL CORONAVIRUS, NAA: SARS-CoV-2, NAA: DETECTED — AB

## 2020-02-09 LAB — SPECIMEN STATUS REPORT

## 2020-02-10 ENCOUNTER — Telehealth (INDEPENDENT_AMBULATORY_CARE_PROVIDER_SITE_OTHER): Payer: BC Managed Care – PPO | Admitting: Family Medicine

## 2020-02-10 ENCOUNTER — Telehealth: Payer: Self-pay | Admitting: Family Medicine

## 2020-02-10 ENCOUNTER — Encounter: Payer: Self-pay | Admitting: Family Medicine

## 2020-02-10 DIAGNOSIS — U071 COVID-19: Secondary | ICD-10-CM

## 2020-02-10 MED ORDER — DIPHENOXYLATE-ATROPINE 2.5-0.025 MG PO TABS
1.0000 | ORAL_TABLET | Freq: Four times a day (QID) | ORAL | 0 refills | Status: AC | PRN
Start: 2020-02-10 — End: ?

## 2020-02-10 MED ORDER — ONDANSETRON HCL 8 MG PO TABS: 8.0000 mg | ORAL_TABLET | Freq: Three times a day (TID) | ORAL | 0 refills | Status: AC | PRN

## 2020-02-10 NOTE — Progress Notes (Signed)
   Subjective:    Patient ID: Mitchell Holden, male    DOB: 08/11/1960, 59 y.o.   MRN: [id number]  HPI    Very nice patient having difficult time with diarrhea. Also difficult time with nausea no vomiting. Energy level is low. Is positive for Covid. No wheezing or difficulty breathing currently. Patient has not checked his blood pressure or oxygen status today. Pretty much staying in bed for the most part states he is drinking enough to be urinating denies feeling dizzy when he stands up no passing out   Pt having diarrhea and weakness. Pt states he began and antibiotic on 02/05/20 and that caused the diarrhea. Pt is no longer taking Zithromax. Pt did take an anti diarrheal so he could put something on his stomach and he felt good that day.  Virtual Visit via Telephone Note  I connected with Mitchell Holden on 02/10/20 at  4:10 PM EDT by telephone and verified that I am speaking with the correct person using two identifiers.  Location: Patient: home Provider: office   I discussed the limitations, risks, security and privacy concerns of performing an evaluation and management service by telephone and the availability of in person appointments. I also discussed with the patient that there may be a patient responsible charge related to this service. The patient expressed understanding and agreed to proceed.   History of Present Illness:    Observations/Objective:   Assessment and Plan:   Follow Up Instructions:    I discussed the assessment and treatment plan with the patient. The patient was provided an opportunity to ask questions and all were answered. The patient agreed with the plan and demonstrated an understanding of the instructions.   The patient was advised to call back or seek an in-person evaluation if the symptoms worsen or if the condition fails to improve as anticipated.  I provided 20 minutes of non-face-to-face time during this encounter.       Review  of Systems Please see above    Objective:   Physical Exam  Today's visit was via telephone Physical exam was not possible for this visit       Assessment & Plan:  I am concerned about this patient currently. I encouraged him to consider getting the monoclonal antibody. The patient has concerns because it is not fully approved by the FDA. I tried to reassure the patient that we are dealing with a illness that has not been around long enough for most treatments to be FDA approved. Previously the patient had mentioned using ivermectin to treat his illness and I have told him before I would not be in favor of doing so. I have encouraged him to check his blood pressures regularly. If he starts having low blood pressures or oxygen saturations in the 80s that is a sign he needs to immediately go to the ER. We will check in with the patient tomorrow. Referral to antibody infusion center was made.

## 2020-02-10 NOTE — Telephone Encounter (Signed)
Mr. Mitchell Holden, Mitchell Holden are scheduled for a virtual visit with your provider today.    Just as we do with appointments in the office, we must obtain your consent to participate.  Your consent will be active for this visit and any virtual visit you may have with one of our providers in the next 365 days.    If you have a MyChart account, I can also send a copy of this consent to you electronically.  All virtual visits are billed to your insurance company just like a traditional visit in the office.  As this is a virtual visit, video technology does not allow for your provider to perform a traditional examination.  This may limit your provider's ability to fully assess your condition.  If your provider identifies any concerns that need to be evaluated in person or the need to arrange testing such as labs, EKG, etc, we will make arrangements to do so.    Although advances in technology are sophisticated, we cannot ensure that it will always work on either your end or our end.  If the connection with a video visit is poor, we may have to switch to a telephone visit.  With either a video or telephone visit, we are not always able to ensure that we have a secure connection.   I need to obtain your verbal consent now.   Are you willing to proceed with your visit today?   [name] has provided verbal consent on 02/10/2020 for a virtual visit (video or telephone).   Marlowe Shores, LPN 08/18/5571  2:20 PM

## 2020-02-12 ENCOUNTER — Telehealth (HOSPITAL_COMMUNITY): Payer: Self-pay | Admitting: Nurse Practitioner

## 2020-02-12 NOTE — Telephone Encounter (Signed)
Called to Discuss with patient about Covid symptoms and the use of the monoclonal antibody infusion for those with mild to moderate Covid symptoms and at a high risk of hospitalization.     Pt appears to qualify for this infusion due to co-morbid conditions and/or a member of an at-risk group in accordance with the FDA Emergency Use Authorization.    Unable to reach pt. Voicemail left and My Chart message sent.    Gerlean Ren, DNP, AGNP-C

## 2020-02-15 ENCOUNTER — Telehealth: Payer: Self-pay | Admitting: Family Medicine

## 2020-02-15 NOTE — Telephone Encounter (Signed)
Jeffre was admitted Saturday to [facility] for Covid bo him and his wife have Covid and wife called and wanted to inform up of this.    Wife call back [phone]

## 2020-03-02 ENCOUNTER — Telehealth: Payer: Self-pay

## 2020-03-02 NOTE — Telephone Encounter (Signed)
Wife Mitchell Holden came in today to see Dr. Lorin Picket and told us her husband had passed away on March 24, 2020 due to Covid.  He died at the [facility].

## 2020-03-04 NOTE — Telephone Encounter (Signed)
I had following along with this patient's results. I would also talked with the wife some and then they were unable to be reached and I left messages. Unfortunately he did poorly during his hospital stay and died due to [facility]

## 2020-03-28 DEATH — deceased
